# Patient Record
Sex: Female | Born: 1935 | Race: White | Hispanic: No | Smoking: Never smoker
Health system: Southern US, Community
[De-identification: ages and names within clinical notes are randomized; demographics above are authoritative.]

## PROBLEM LIST (undated history)

## (undated) DIAGNOSIS — G309 Alzheimer's disease, unspecified: Secondary | ICD-10-CM

## (undated) DIAGNOSIS — D649 Anemia, unspecified: Secondary | ICD-10-CM

## (undated) DIAGNOSIS — H9190 Unspecified hearing loss, unspecified ear: Secondary | ICD-10-CM

## (undated) DIAGNOSIS — R413 Other amnesia: Secondary | ICD-10-CM

## (undated) DIAGNOSIS — I10 Essential (primary) hypertension: Secondary | ICD-10-CM

## (undated) DIAGNOSIS — F0280 Dementia in other diseases classified elsewhere without behavioral disturbance: Secondary | ICD-10-CM

## (undated) DIAGNOSIS — F32A Depression, unspecified: Secondary | ICD-10-CM

## (undated) DIAGNOSIS — F329 Major depressive disorder, single episode, unspecified: Secondary | ICD-10-CM

## (undated) DIAGNOSIS — E559 Vitamin D deficiency, unspecified: Secondary | ICD-10-CM

## (undated) DIAGNOSIS — I340 Nonrheumatic mitral (valve) insufficiency: Secondary | ICD-10-CM

## (undated) DIAGNOSIS — I351 Nonrheumatic aortic (valve) insufficiency: Secondary | ICD-10-CM

## (undated) HISTORY — DX: Anemia, unspecified: D64.9

## (undated) HISTORY — PX: TONSILLECTOMY: SUR1361

## (undated) HISTORY — DX: Nonrheumatic aortic (valve) insufficiency: I35.1

## (undated) HISTORY — DX: Vitamin D deficiency, unspecified: E55.9

## (undated) HISTORY — PX: CHOLECYSTECTOMY: SHX55

## (undated) HISTORY — DX: Nonrheumatic mitral (valve) insufficiency: I34.0

## (undated) HISTORY — DX: Unspecified hearing loss, unspecified ear: H91.90

## (undated) HISTORY — DX: Other amnesia: R41.3

---

## 1997-09-19 ENCOUNTER — Inpatient Hospital Stay (HOSPITAL_COMMUNITY): Admission: RE | Admit: 1997-09-19 | Discharge: 1997-09-20 | Payer: Self-pay | Admitting: General Surgery

## 1997-09-28 ENCOUNTER — Other Ambulatory Visit: Admission: RE | Admit: 1997-09-28 | Discharge: 1997-09-28 | Payer: Self-pay | Admitting: Obstetrics and Gynecology

## 1998-10-30 ENCOUNTER — Other Ambulatory Visit: Admission: RE | Admit: 1998-10-30 | Discharge: 1998-10-30 | Payer: Self-pay | Admitting: Obstetrics and Gynecology

## 1999-10-22 ENCOUNTER — Other Ambulatory Visit: Admission: RE | Admit: 1999-10-22 | Discharge: 1999-10-22 | Payer: Self-pay | Admitting: Obstetrics and Gynecology

## 2000-10-25 ENCOUNTER — Other Ambulatory Visit: Admission: RE | Admit: 2000-10-25 | Discharge: 2000-10-25 | Payer: Self-pay | Admitting: Obstetrics and Gynecology

## 2001-10-28 ENCOUNTER — Other Ambulatory Visit: Admission: RE | Admit: 2001-10-28 | Discharge: 2001-10-28 | Payer: Self-pay | Admitting: Obstetrics and Gynecology

## 2002-10-30 ENCOUNTER — Other Ambulatory Visit: Admission: RE | Admit: 2002-10-30 | Discharge: 2002-10-30 | Payer: Self-pay | Admitting: Obstetrics and Gynecology

## 2003-06-18 ENCOUNTER — Ambulatory Visit (HOSPITAL_COMMUNITY): Admission: RE | Admit: 2003-06-18 | Discharge: 2003-06-18 | Payer: Self-pay | Admitting: Otolaryngology

## 2003-11-02 ENCOUNTER — Other Ambulatory Visit: Admission: RE | Admit: 2003-11-02 | Discharge: 2003-11-02 | Payer: Self-pay | Admitting: Obstetrics and Gynecology

## 2004-11-04 ENCOUNTER — Other Ambulatory Visit: Admission: RE | Admit: 2004-11-04 | Discharge: 2004-11-04 | Payer: Self-pay | Admitting: Obstetrics and Gynecology

## 2007-06-27 ENCOUNTER — Encounter: Admission: RE | Admit: 2007-06-27 | Discharge: 2007-06-27 | Payer: Self-pay | Admitting: Family Medicine

## 2009-01-15 ENCOUNTER — Ambulatory Visit: Payer: Self-pay | Admitting: Internal Medicine

## 2009-01-15 DIAGNOSIS — I10 Essential (primary) hypertension: Secondary | ICD-10-CM | POA: Insufficient documentation

## 2010-12-30 ENCOUNTER — Emergency Department (HOSPITAL_COMMUNITY): Payer: Medicare Other

## 2010-12-30 ENCOUNTER — Other Ambulatory Visit: Payer: Self-pay

## 2010-12-30 ENCOUNTER — Emergency Department (HOSPITAL_COMMUNITY): Payer: Self-pay

## 2010-12-30 ENCOUNTER — Encounter: Payer: Self-pay | Admitting: *Deleted

## 2010-12-30 ENCOUNTER — Observation Stay (HOSPITAL_COMMUNITY)
Admission: EM | Admit: 2010-12-30 | Discharge: 2011-01-01 | Disposition: A | Discharge status: Home / Self Care | Payer: Medicare Other | Attending: Family Medicine | Admitting: Family Medicine

## 2010-12-30 DIAGNOSIS — G309 Alzheimer's disease, unspecified: Secondary | ICD-10-CM | POA: Insufficient documentation

## 2010-12-30 DIAGNOSIS — R404 Transient alteration of awareness: Secondary | ICD-10-CM | POA: Insufficient documentation

## 2010-12-30 DIAGNOSIS — I517 Cardiomegaly: Secondary | ICD-10-CM | POA: Insufficient documentation

## 2010-12-30 DIAGNOSIS — R030 Elevated blood-pressure reading, without diagnosis of hypertension: Secondary | ICD-10-CM | POA: Insufficient documentation

## 2010-12-30 DIAGNOSIS — Z66 Do not resuscitate: Secondary | ICD-10-CM | POA: Insufficient documentation

## 2010-12-30 DIAGNOSIS — F0280 Dementia in other diseases classified elsewhere without behavioral disturbance: Secondary | ICD-10-CM | POA: Diagnosis present

## 2010-12-30 DIAGNOSIS — J690 Pneumonitis due to inhalation of food and vomit: Secondary | ICD-10-CM | POA: Insufficient documentation

## 2010-12-30 DIAGNOSIS — R29810 Facial weakness: Secondary | ICD-10-CM | POA: Insufficient documentation

## 2010-12-30 DIAGNOSIS — R7309 Other abnormal glucose: Secondary | ICD-10-CM | POA: Insufficient documentation

## 2010-12-30 DIAGNOSIS — Q283 Other malformations of cerebral vessels: Secondary | ICD-10-CM | POA: Insufficient documentation

## 2010-12-30 DIAGNOSIS — I10 Essential (primary) hypertension: Secondary | ICD-10-CM

## 2010-12-30 DIAGNOSIS — I700 Atherosclerosis of aorta: Secondary | ICD-10-CM | POA: Insufficient documentation

## 2010-12-30 DIAGNOSIS — I672 Cerebral atherosclerosis: Secondary | ICD-10-CM | POA: Insufficient documentation

## 2010-12-30 DIAGNOSIS — R739 Hyperglycemia, unspecified: Secondary | ICD-10-CM

## 2010-12-30 DIAGNOSIS — Z79899 Other long term (current) drug therapy: Secondary | ICD-10-CM | POA: Insufficient documentation

## 2010-12-30 DIAGNOSIS — D696 Thrombocytopenia, unspecified: Secondary | ICD-10-CM | POA: Insufficient documentation

## 2010-12-30 DIAGNOSIS — R1110 Vomiting, unspecified: Secondary | ICD-10-CM | POA: Insufficient documentation

## 2010-12-30 DIAGNOSIS — R4789 Other speech disturbances: Secondary | ICD-10-CM | POA: Insufficient documentation

## 2010-12-30 DIAGNOSIS — G459 Transient cerebral ischemic attack, unspecified: Principal | ICD-10-CM | POA: Insufficient documentation

## 2010-12-30 DIAGNOSIS — I6789 Other cerebrovascular disease: Secondary | ICD-10-CM | POA: Insufficient documentation

## 2010-12-30 HISTORY — DX: Depression, unspecified: F32.A

## 2010-12-30 HISTORY — DX: Alzheimer's disease, unspecified: G30.9

## 2010-12-30 HISTORY — DX: Dementia in other diseases classified elsewhere, unspecified severity, without behavioral disturbance, psychotic disturbance, mood disturbance, and anxiety: F02.80

## 2010-12-30 HISTORY — DX: Essential (primary) hypertension: I10

## 2010-12-30 HISTORY — DX: Major depressive disorder, single episode, unspecified: F32.9

## 2010-12-30 LAB — BASIC METABOLIC PANEL
CO2: 26 mEq/L (ref 19–32)
Calcium: 9.7 mg/dL (ref 8.4–10.5)
Chloride: 102 mEq/L (ref 96–112)
GFR calc Af Amer: 68 mL/min — ABNORMAL LOW (ref >90)
GFR calc non Af Amer: 59 mL/min — ABNORMAL LOW (ref >90)

## 2010-12-30 LAB — DIFFERENTIAL
Basophils Absolute: 0 10*3/uL (ref 0.0–0.1)
Basophils Relative: 0 % (ref 0–1)
Eosinophils Absolute: 0 10*3/uL (ref 0.0–0.7)
Monocytes Absolute: 0.4 10*3/uL (ref 0.1–1.0)
Monocytes Relative: 3 % (ref 3–12)
Neutro Abs: 10.4 10*3/uL — ABNORMAL HIGH (ref 1.7–7.7)

## 2010-12-30 LAB — URINALYSIS, ROUTINE W REFLEX MICROSCOPIC
Bilirubin Urine: NEGATIVE
Glucose, UA: NEGATIVE mg/dL
Hgb urine dipstick: NEGATIVE
Ketones, ur: NEGATIVE mg/dL
Leukocytes, UA: NEGATIVE
Nitrite: NEGATIVE
Protein, ur: NEGATIVE mg/dL
Specific Gravity, Urine: 1.018 (ref 1.005–1.030)
Urobilinogen, UA: 1 mg/dL (ref 0.0–1.0)
pH: 5.5 (ref 5.0–8.0)

## 2010-12-30 LAB — CBC
HCT: 46.3 % — ABNORMAL HIGH (ref 36.0–46.0)
Hemoglobin: 15.7 g/dL — ABNORMAL HIGH (ref 12.0–15.0)
MCH: 31.9 pg (ref 26.0–34.0)
MCHC: 33.9 g/dL (ref 30.0–36.0)
MCV: 94.1 fL (ref 78.0–100.0)
Platelets: 129 K/uL — ABNORMAL LOW (ref 150–400)
RBC: 4.92 MIL/uL (ref 3.87–5.11)
RDW: 13.3 % (ref 11.5–15.5)
WBC: 10.9 K/uL — ABNORMAL HIGH (ref 4.0–10.5)

## 2010-12-30 LAB — PROTIME-INR
INR: 1.04 (ref 0.00–1.49)
Prothrombin Time: 13.8 s (ref 11.6–15.2)

## 2010-12-30 LAB — GLUCOSE, CAPILLARY: Glucose-Capillary: 112 mg/dL — ABNORMAL HIGH (ref 70–99)

## 2010-12-30 LAB — CK TOTAL AND CKMB (NOT AT ARMC): Total CK: 105 U/L (ref 7–177)

## 2010-12-30 LAB — APTT: aPTT: 30 s (ref 24–37)

## 2010-12-30 MED ORDER — ACETAMINOPHEN 325 MG PO TABS: 650.0000 mg | ORAL_TABLET | ORAL | Status: DC | PRN

## 2010-12-30 MED ORDER — MEMANTINE HCL 10 MG PO TABS
10.0000 mg | ORAL_TABLET | Freq: Two times a day (BID) | ORAL | Status: DC
  Administered 2010-12-30 – 2011-01-01 (×4): 10 mg via ORAL
  Filled 2010-12-30 (×5): qty 1

## 2010-12-30 MED ORDER — ENOXAPARIN SODIUM 40 MG/0.4ML ~~LOC~~ SOLN
40.0000 mg | SUBCUTANEOUS | Status: DC
  Administered 2010-12-31: 40 mg via SUBCUTANEOUS
  Filled 2010-12-30 (×3): qty 0.4

## 2010-12-30 MED ORDER — ASPIRIN 325 MG PO TABS
325.0000 mg | ORAL_TABLET | Freq: Every day | ORAL | Status: DC
  Administered 2010-12-31 – 2011-01-01 (×2): 325 mg via ORAL
  Filled 2010-12-30 (×2): qty 1

## 2010-12-30 MED ORDER — SODIUM CHLORIDE 0.9 % IJ SOLN
3.0000 mL | Freq: Two times a day (BID) | INTRAMUSCULAR | Status: DC
  Administered 2010-12-30 – 2011-01-01 (×4): 3 mL via INTRAVENOUS

## 2010-12-30 MED ORDER — DONEPEZIL HCL 10 MG PO TABS
10.0000 mg | ORAL_TABLET | Freq: Every day | ORAL | Status: DC
  Administered 2010-12-30 – 2010-12-31 (×2): 10 mg via ORAL
  Filled 2010-12-30 (×3): qty 1

## 2010-12-30 MED ORDER — SERTRALINE HCL 25 MG PO TABS
25.0000 mg | ORAL_TABLET | Freq: Every evening | ORAL | Status: DC
  Administered 2010-12-30 – 2010-12-31 (×2): 25 mg via ORAL
  Filled 2010-12-30 (×3): qty 1

## 2010-12-30 MED ORDER — ONDANSETRON HCL 4 MG/2ML IJ SOLN: 4.0000 mg | Freq: Four times a day (QID) | INTRAMUSCULAR | Status: DC | PRN

## 2010-12-30 NOTE — H&P (Addendum)
Michelle Savage is an 77 y.o. female.    PCP: No primary provider on file. Her PCP is Dr. Cam Hai. Her neurologist is Dr. Anne Hahn (dementia).  Chief Complaint: Vomiting, and facial droop. This morning  HPI: Patient is 77 year old, Caucasian female, with past with history of dementia and borderline hypertension, who was in her usual state of health when she woke up this morning at around 7:00 or so. Her aide stayed with her last night and history is obtained from her. Patient apparently had a large episode of emesis. She saw that patient had a droop on the right side of her face. She was also leaning to was the right side. She called her daughter, who asked that she be brought into the emergency department. Patient was not very coherent, her speech was impaired and she was mumbling. She did however, get on her feet on her own. She was however wobbling. After a brief while she was able to steady herself and then she went on to take a shower before she came in to the hospital. There is no history of any syncopal episode. Patient does not remember any of the above. So, most of the history was obtained from the patient's daughter and the nursing aide. She patient does denies any chest pain at this time. Patient also had pain in her left leg, which is now resolved. The aide does not notice any other focal deficits. Both the aide and the daughter think that the patient is back to her baseline.  The daughter also told me that the patient's dementia has been progressing. Patient has had some wandering episodes. And that's why the aide decided to stay overnight. The patient also had a dog who died about a month ago, and the patient has not dealt with that loss appropriately and at times still asks about the dog.   Prior to Admission medications   Medication Sig Start Date End Date Taking? Authorizing Provider  Calcium-Vitamin D 600-200 MG-UNIT per tablet Take 1 tablet by mouth daily.     Yes  Historical Provider, MD  donepezil (ARICEPT) 10 MG tablet Take 10 mg by mouth daily.    Yes Historical Provider, MD  fish oil-omega-3 fatty acids 1000 MG capsule Take 1 g by mouth daily.    Yes Historical Provider, MD  memantine (NAMENDA) 10 MG tablet Take 10 mg by mouth 2 (two) times daily.     Yes Historical Provider, MD  sertraline (ZOLOFT) 25 MG tablet Take 25 mg by mouth every evening.     Yes Historical Provider, MD    Allergies: No Known Allergies  Past Medical History  Diagnosis Date  . Alzheimer disease   . Depression   . Hypertension     Past Surgical History  Procedure Date  . Cholecystectomy     Social History:  reports that she has never smoked. She has never used smokeless tobacco. She reports that she does not drink alcohol or use illicit drugs.  Family History: History reviewed. No pertinent family history.  Review of Systems  Unable to perform ROS: dementia   Blood pressure 101/51, pulse 82, temperature 99.2 F (37.3 C), temperature source Oral, resp. rate 20, weight 68.2 kg (150 lb 5.7 oz), SpO2 95.00%. Physical Exam  Vitals reviewed. Constitutional: She appears well-developed and well-nourished. No distress.  HENT:  Head: Normocephalic and atraumatic.  Mouth/Throat: Oropharynx is clear and moist. No oropharyngeal exudate.  Eyes: No scleral icterus.  Neck: Normal range of motion. Neck  supple. No JVD present. No tracheal deviation present. No thyromegaly present.  Cardiovascular: Normal rate and regular rhythm.  Exam reveals no gallop and no friction rub.   No murmur heard. Pulmonary/Chest: No stridor.  Abdominal: Soft. Bowel sounds are normal. She exhibits no distension and no mass. There is no tenderness. There is no rebound and no guarding.  Musculoskeletal: Normal range of motion. She exhibits no edema and no tenderness.       Left leg did not show any deformity. No tenderness was elicited in the calf or elsewhere. No erythema noted.  Neurological: She  is alert. She has normal strength and normal reflexes. She is disoriented. No cranial nerve deficit or sensory deficit. Coordination normal.  Skin: Skin is warm and dry. No rash noted. She is not diaphoretic.  Psychiatric: She has a normal mood and affect.    Results for orders placed during the hospital encounter of 12/30/10 (from the past 48 hour(s))  PROTIME-INR     Status: Normal   Collection Time   12/30/10 12:30 PM      Component Value Range Comment   Prothrombin Time 13.8  11.6 - 15.2 (seconds)    INR 1.04  0.00 - 1.49    APTT     Status: Normal   Collection Time   12/30/10 12:30 PM      Component Value Range Comment   aPTT 30  24 - 37 (seconds)   CBC     Status: Abnormal   Collection Time   12/30/10 12:30 PM      Component Value Range Comment   WBC 10.9 (*) 4.0 - 10.5 (K/uL)    RBC 4.92  3.87 - 5.11 (MIL/uL)    Hemoglobin 15.7 (*) 12.0 - 15.0 (g/dL)    HCT 16.1 (*) 09.6 - 46.0 (%)    MCV 94.1  78.0 - 100.0 (fL)    MCH 31.9  26.0 - 34.0 (pg)    MCHC 33.9  30.0 - 36.0 (g/dL)    RDW 04.5  40.9 - 81.1 (%)    Platelets 129 (*) 150 - 400 (K/uL)   DIFFERENTIAL     Status: Abnormal   Collection Time   12/30/10 12:30 PM      Component Value Range Comment   Neutrophils Relative 96 (*) 43 - 77 (%)    Neutro Abs 10.4 (*) 1.7 - 7.7 (K/uL)    Lymphocytes Relative 1 (*) 12 - 46 (%)    Lymphs Abs 0.1 (*) 0.7 - 4.0 (K/uL)    Monocytes Relative 3  3 - 12 (%)    Monocytes Absolute 0.4  0.1 - 1.0 (K/uL)    Eosinophils Relative 0  0 - 5 (%)    Eosinophils Absolute 0.0  0.0 - 0.7 (K/uL)    Basophils Relative 0  0 - 1 (%)    Basophils Absolute 0.0  0.0 - 0.1 (K/uL)   CK TOTAL AND CKMB     Status: Normal   Collection Time   12/30/10 12:30 PM      Component Value Range Comment   Total CK 105  7 - 177 (U/L)    CK, MB 2.6  0.3 - 4.0 (ng/mL)    Relative Index 2.5  0.0 - 2.5    TROPONIN I     Status: Normal   Collection Time   12/30/10 12:30 PM      Component Value Range Comment    Troponin I <0.30  <0.30 (ng/mL)   BASIC  METABOLIC PANEL     Status: Abnormal   Collection Time   12/30/10 12:30 PM      Component Value Range Comment   Sodium 141  135 - 145 (mEq/L)    Potassium 3.7  3.5 - 5.1 (mEq/L)    Chloride 102  96 - 112 (mEq/L)    CO2 26  19 - 32 (mEq/L)    Glucose, Bld 200 (*) 70 - 99 (mg/dL)    BUN 35 (*) 6 - 23 (mg/dL)    Creatinine, Ser 1.61  0.50 - 1.10 (mg/dL)    Calcium 9.7  8.4 - 10.5 (mg/dL)    GFR calc non Af Amer 59 (*) >90 (mL/min)    GFR calc Af Amer 68 (*) >90 (mL/min)   URINALYSIS, ROUTINE W REFLEX MICROSCOPIC     Status: Abnormal   Collection Time   12/30/10  2:25 PM      Component Value Range Comment   Color, Urine YELLOW  YELLOW     Appearance CLOUDY (*) CLEAR     Specific Gravity, Urine 1.018  1.005 - 1.030     pH 5.5  5.0 - 8.0     Glucose, UA NEGATIVE  NEGATIVE (mg/dL)    Hgb urine dipstick NEGATIVE  NEGATIVE     Bilirubin Urine NEGATIVE  NEGATIVE     Ketones, ur NEGATIVE  NEGATIVE (mg/dL)    Protein, ur NEGATIVE  NEGATIVE (mg/dL)    Urobilinogen, UA 1.0  0.0 - 1.0 (mg/dL)    Nitrite NEGATIVE  NEGATIVE     Leukocytes, UA NEGATIVE  NEGATIVE  MICROSCOPIC NOT DONE ON URINES WITH NEGATIVE PROTEIN, BLOOD, LEUKOCYTES, NITRITE, OR GLUCOSE <1000 mg/dL.   Ct Head Wo Contrast  12/30/2010  *RADIOLOGY REPORT*  Clinical Data: Altered level of consciousness, emesis,  memory loss  CT HEAD WITHOUT CONTRAST  Technique:  Contiguous axial images were obtained from the base of the skull through the vertex without contrast.  Comparison: MRI scan of Jun 27, 2007  Findings: Bony calvarium appears intact.  No mass effect or midline shift is noted.  Ventricular size is within normal limits.  Chronic ischemic white matter disease is noted in the periventricular region.  There is no evidence of mass, hemorrhage or acute infarction.  IMPRESSION: Chronic ischemic white matter disease.  No acute intracranial abnormality seen.  Original Report Authenticated By: Venita Sheffield., M.D.   EKG shows a sinus rhythm at 65 beats per minute. PACs are noted. Axis is normal. Intervals appear to be in the normal range. No Q waves. No concerning ST or T-wave changes are noted. No older EKGs available for comparison.  Assessment/Plan  Principal Problem:  *TIA (transient ischemic attack) Active Problems:  HYPERTENSION  Dementia  Hyperglycemia   #1 questionable TIA: We'll admit the patient to the hospital for further workup. This will be in the form of MRI of the brain, MRA of the head, Carotid Dopplers, echocardiogram. Lipid panel will be checked in the morning. Once she passes her swallow screen she will be given aspirin by mouth. Physical therapy and occupational therapy will be asked to see the patient as well.  #2 hyperglycemia: Patient does not have a history of diabetes. We will recheck another fasting level in the morning and will proceed with the HbA1c as well. For now we'll hold off on sliding scale.  #3 history of dementia: Continue with Aricept and Namenda. Considering her episodes of wandering patient may require 24-hour care at home. Will  await the evaluation by physical therapy and occupational therapy at this time.  #4 history of hypertension: This is borderline. Patient used to be on atenolol until a few months ago, but is no longer taking it. This is because, according to the daughter, the blood pressure has been very well controlled.  The reason for her vomiting is not clear. She is currently not nauseous. Her abdomen is soft. We will monitor for now without additional testing.  DVT, prophylaxis will be initiated.  CODE STATUS was discussed with the patient's daughter and the patient will be a DO NOT RESUSCITATE.  Further management decisions will depend on results of further testing and patient's response to treatment.  Chesley Valls 12/30/2010, 3:44 PM

## 2010-12-30 NOTE — ED Notes (Signed)
Per care giver pt disoriented this am w/speech change, uncoordinated, and R jaw drooping. Pt's speech thick and slurred, pt unsure of surroundings, time of day, "half asleep," and weakness. No neuro deficits noted, no facial droop, PERRL. Pt began vomiting during the night, care giver found vomit on bedroom floor, bed sheets, and "all over" bathroom floor and toilet. Pt denies abd pain, chest pain, headache at his time. Care giver reports assisting pt to the bed and pt c/o L leg pain while holding pt's L leg up

## 2010-12-30 NOTE — ED Notes (Signed)
Tresa Endo RN in [facility] unable to take report on any new pt's at this time, TCU is full, Eula Listen will return writers call when she dc any of her pt

## 2010-12-30 NOTE — ED Notes (Signed)
Attempt x1 to give report to receiving RN, RN unable to take report at this time

## 2010-12-30 NOTE — ED Notes (Signed)
Daughter states "vomiting probably started around 0500, caregiver went in around 0845 and vomit was everywhere, not talking right & leaning to the side"

## 2010-12-30 NOTE — ED Notes (Signed)
Bess RN given report, Leisure centre manager requested we bring pt upstairs at [facility] per floor policy, Leisure centre manager will give oncoming RN nursing report, oncoming ED RN does not need to call and give report, just give a call prior to transport, no RN to RN report needed

## 2010-12-30 NOTE — ED Notes (Signed)
Attempt x2 to give receiving RN report, RN unavailable

## 2010-12-30 NOTE — ED Provider Notes (Signed)
History     CSN: [id number] Arrival date & time: 12/30/2010 11:05 AM   First MD Initiated Contact with Patient 12/30/10 1118      Chief Complaint  Patient presents with  . Emesis  . Memory Loss    (Consider location/radiation/quality/duration/timing/severity/associated sxs/prior treatment) HPI Comments: Patient presents today with her daughter and her caregiver with complaints of nausea and vomiting this morning.  The caregivers stayed overnight with the patient and her get up at approximately 5 AM but did not realize she had emesis at that time so she went to check on her approximately 7:30.  Patient was back in bed at that time.  She noted that he upon the patient's waking she had left facial droop and slurred speech that was incoherent.  Patient seemed more unsteady on her feet as well.  This is improving already is patient's speech is not coherent although family notes that it is still different from her baseline.  There is no specific weakness and no further nausea or vomiting has occurred.  Patient was normal last night when she went to bed.  No prior history for any strokes.  No new medications given.  No fevers.  Patient is a 77 y.o. female presenting with vomiting. The history is provided by the patient and a relative.  Emesis  This is a new problem. The problem has been rapidly improving. There has been no fever. Pertinent negatives include no abdominal pain, no chills, no cough, no diarrhea, no fever and no headaches.    Past Medical History  Diagnosis Date  . Alzheimer disease   . Depression     Past Surgical History  Procedure Date  . Cholecystectomy     No family history on file.  History  Substance Use Topics  . Smoking status: Never Smoker   . Smokeless tobacco: Not on file  . Alcohol Use: No    OB History    Grav Para Term Preterm Abortions TAB SAB Ect Mult Living                  Review of Systems  Constitutional: Negative for fever and chills.    Eyes: Negative.  Negative for discharge and redness.  Respiratory: Negative.  Negative for cough and shortness of breath.   Cardiovascular: Negative.  Negative for chest pain.  Gastrointestinal: Positive for nausea and vomiting. Negative for abdominal pain and diarrhea.  Genitourinary: Negative.  Negative for dysuria and vaginal discharge.  Musculoskeletal: Negative.  Negative for back pain.  Skin: Negative.  Negative for color change and rash.  Neurological: Positive for speech difficulty and weakness. Negative for syncope and headaches.  Hematological: Negative.  Negative for adenopathy.  Psychiatric/Behavioral: Negative.  Negative for confusion.  All other systems reviewed and are negative.    Allergies  Review of patient's allergies indicates no known allergies.  Home Medications   Current Outpatient Rx  Name Route Sig Dispense Refill  . DONEPEZIL HCL 10 MG PO TABS Oral Take 10 mg by mouth at bedtime as needed.      . OMEGA-3 FATTY ACIDS 1000 MG PO CAPS Oral Take 2 g by mouth daily.      Marland Kitchen MEMANTINE HCL 10 MG PO TABS Oral Take 10 mg by mouth 2 (two) times daily.        BP 101/51  Pulse 82  Temp(Src) 98.7 F (37.1 C) (Oral)  Resp 20  Wt 150 lb 5.7 oz (68.2 kg)  SpO2 95%  Physical Exam  Constitutional: She is oriented to person, place, and time. She appears well-developed and well-nourished.  Non-toxic appearance. She does not have a sickly appearance.  HENT:  Head: Normocephalic and atraumatic.  Eyes: Conjunctivae, EOM and lids are normal. Pupils are equal, round, and reactive to light. No scleral icterus.  Neck: Trachea normal and normal range of motion. Neck supple.  Cardiovascular: Normal rate, regular rhythm and normal heart sounds.   Pulmonary/Chest: Effort normal and breath sounds normal. No respiratory distress. She has no wheezes. She has no rales. She exhibits no tenderness.  Abdominal: Soft. Normal appearance. There is no tenderness. There is no rebound, no  guarding and no CVA tenderness.  Musculoskeletal: Normal range of motion.  Neurological: She is alert and oriented to person, place, and time. She has normal strength.       Patient  face appears symmetric to me.  Tongue is midline.  Speech is clear.  No visual field deficits.  Extraocular eye movements are normal.  No pronator drift.  Normal finger nose bilaterally.  Normal heel to shin bilaterally.  Normal strength in her upper extremities and lower extremities.  Sensation to light touch is intact.  Normal gait.  Skin: Skin is warm, dry and intact. No rash noted.  Psychiatric: She has a normal mood and affect. Her behavior is normal. Judgment and thought content normal.    ED Course  Procedures (including critical care time)    Results for orders placed during the hospital encounter of 12/30/10  PROTIME-INR      Component Value Range   Prothrombin Time 13.8  11.6 - 15.2 (seconds)   INR 1.04  0.00 - 1.49   APTT      Component Value Range   aPTT 30  24 - 37 (seconds)  CBC      Component Value Range   WBC 10.9 (*) 4.0 - 10.5 (K/uL)   RBC 4.92  3.87 - 5.11 (MIL/uL)   Hemoglobin 15.7 (*) 12.0 - 15.0 (g/dL)   HCT 40.9 (*) 81.1 - 46.0 (%)   MCV 94.1  78.0 - 100.0 (fL)   MCH 31.9  26.0 - 34.0 (pg)   MCHC 33.9  30.0 - 36.0 (g/dL)   RDW 91.4  78.2 - 95.6 (%)   Platelets 129 (*) 150 - 400 (K/uL)  DIFFERENTIAL      Component Value Range   Neutrophils Relative 96 (*) 43 - 77 (%)   Neutro Abs 10.4 (*) 1.7 - 7.7 (K/uL)   Lymphocytes Relative 1 (*) 12 - 46 (%)   Lymphs Abs 0.1 (*) 0.7 - 4.0 (K/uL)   Monocytes Relative 3  3 - 12 (%)   Monocytes Absolute 0.4  0.1 - 1.0 (K/uL)   Eosinophils Relative 0  0 - 5 (%)   Eosinophils Absolute 0.0  0.0 - 0.7 (K/uL)   Basophils Relative 0  0 - 1 (%)   Basophils Absolute 0.0  0.0 - 0.1 (K/uL)  CK TOTAL AND CKMB      Component Value Range   Total CK 105  7 - 177 (U/L)   CK, MB 2.6  0.3 - 4.0 (ng/mL)   Relative Index 2.5  0.0 - 2.5   TROPONIN I       Component Value Range   Troponin I <0.30  <0.30 (ng/mL)  BASIC METABOLIC PANEL      Component Value Range   Sodium 141  135 - 145 (mEq/L)   Potassium 3.7  3.5 - 5.1 (mEq/L)  Chloride 102  96 - 112 (mEq/L)   CO2 26  19 - 32 (mEq/L)   Glucose, Bld 200 (*) 70 - 99 (mg/dL)   BUN 35 (*) 6 - 23 (mg/dL)   Creatinine, Ser 1.61  0.50 - 1.10 (mg/dL)   Calcium 9.7  8.4 - 09.6 (mg/dL)   GFR calc non Af Amer 59 (*) >90 (mL/min)   GFR calc Af Amer 68 (*) >90 (mL/min)  URINALYSIS, ROUTINE W REFLEX MICROSCOPIC      Component Value Range   Color, Urine YELLOW  YELLOW    Appearance CLOUDY (*) CLEAR    Specific Gravity, Urine 1.018  1.005 - 1.030    pH 5.5  5.0 - 8.0    Glucose, UA NEGATIVE  NEGATIVE (mg/dL)   Hgb urine dipstick NEGATIVE  NEGATIVE    Bilirubin Urine NEGATIVE  NEGATIVE    Ketones, ur NEGATIVE  NEGATIVE (mg/dL)   Protein, ur NEGATIVE  NEGATIVE (mg/dL)   Urobilinogen, UA 1.0  0.0 - 1.0 (mg/dL)   Nitrite NEGATIVE  NEGATIVE    Leukocytes, UA NEGATIVE  NEGATIVE    Ct Head Wo Contrast  12/30/2010  *RADIOLOGY REPORT*  Clinical Data: Altered level of consciousness, emesis,  memory loss  CT HEAD WITHOUT CONTRAST  Technique:  Contiguous axial images were obtained from the base of the skull through the vertex without contrast.  Comparison: MRI scan of Jun 27, 2007  Findings: Bony calvarium appears intact.  No mass effect or midline shift is noted.  Ventricular size is within normal limits.  Chronic ischemic white matter disease is noted in the periventricular region.  There is no evidence of mass, hemorrhage or acute infarction.  IMPRESSION: Chronic ischemic white matter disease.  No acute intracranial abnormality seen.  Original Report Authenticated By: Venita Sheffield., M.D.    Date: 12/30/2010  Rate: 65  Rhythm: normal sinus rhythm and premature atrial contractions (PAC)  QRS Axis: normal  Intervals: normal  ST/T Wave abnormalities: normal  Conduction Disutrbances:none  Narrative  Interpretation:   Old EKG Reviewed: none available    MDM  Patient brought in with symptoms consistent with TIA.  Significantly improved from earlier although family still notes a small difference from her normal.  She has no other acute findings on her neurologic exam.  She has no acute changes on her CT. She has no symptoms for acute infection at this time.  As patient woke up with symptoms no code stroke was called.  She also did not appear to have significant symptoms at time of arrival.  I have discussed this patient with Dr. Rito Ehrlich from the [facility] hospitalist and they will admit her to a telemetry bed.        Nat Christen, MD 12/30/10 1452

## 2010-12-30 NOTE — ED Notes (Signed)
  Pt is unable to void but has got a IV waiting on fluids to start

## 2010-12-30 NOTE — ED Notes (Signed)
Pt's grip and strength equal bilaterally.

## 2010-12-30 NOTE — ED Notes (Signed)
Pt placed on monitor by [facility] NT

## 2010-12-31 ENCOUNTER — Other Ambulatory Visit (HOSPITAL_COMMUNITY): Payer: Medicare Other

## 2010-12-31 ENCOUNTER — Inpatient Hospital Stay (HOSPITAL_COMMUNITY): Payer: Medicare Other

## 2010-12-31 DIAGNOSIS — G459 Transient cerebral ischemic attack, unspecified: Secondary | ICD-10-CM

## 2010-12-31 DIAGNOSIS — I369 Nonrheumatic tricuspid valve disorder, unspecified: Secondary | ICD-10-CM

## 2010-12-31 LAB — HEMOGLOBIN A1C: Hgb A1c MFr Bld: 5.3 % (ref <5.7)

## 2010-12-31 LAB — LIPID PANEL
Cholesterol: 138 mg/dL (ref 0–200)
LDL Cholesterol: 67 mg/dL (ref 0–99)
Triglycerides: 69 mg/dL (ref <150)

## 2010-12-31 LAB — CBC
Platelets: 119 10*3/uL — ABNORMAL LOW (ref 150–400)
RBC: 4.33 MIL/uL (ref 3.87–5.11)
RDW: 13.5 % (ref 11.5–15.5)
WBC: 6.2 10*3/uL (ref 4.0–10.5)

## 2010-12-31 LAB — BASIC METABOLIC PANEL
CO2: 26 mEq/L (ref 19–32)
Chloride: 102 mEq/L (ref 96–112)
GFR calc Af Amer: 88 mL/min — ABNORMAL LOW (ref >90)
Sodium: 137 mEq/L (ref 135–145)

## 2010-12-31 LAB — GLUCOSE, CAPILLARY: Glucose-Capillary: 101 mg/dL — ABNORMAL HIGH (ref 70–99)

## 2010-12-31 MED ORDER — POTASSIUM CHLORIDE CRYS ER 20 MEQ PO TBCR
40.0000 meq | EXTENDED_RELEASE_TABLET | Freq: Three times a day (TID) | ORAL | Status: AC
  Administered 2010-12-31 (×3): 40 meq via ORAL
  Filled 2010-12-31 (×3): qty 2

## 2010-12-31 NOTE — Progress Notes (Signed)
PROGRESS NOTE  Michelle Savage GEX:[id number] DOB: [birth year] DOA: 12/30/2010  Brief narrative: 77 year old woman who was noted at home to have vomiting and dysarthria and leaning to one side upon awakening approximately 7 AM on day of admission. Per emergency physician documentation it was reported that the patient had left facial droop and slurred speech. Per emergency physician exam the face appeared symmetric and speech was clear. There is no dysdiadochokinesis. Normal strength in the upper and lower extremities was documented.  Per admission documentation the family felt the patient was at baseline on admission. Patient was admitted for possible TIA, hyperglycemia without a history of diabetes, vomiting.  Past medical history: Alzheimer's dementia (progressing, per daughter, wandering noted) , depression. Has an aide that stays during the day.   Subjective: No complaints today. Feeling quite well. No abdominal pain. No vomiting. No leg pain. No focal neurologic issues.  Objective: Filed Vitals:   12/31/10 0641  BP: 105/62  Pulse: 66  Temp: 97.9 F (36.6 C)  Resp: 20   Blood pressure 105/62, pulse 66, temperature 97.9 F (36.6 C), temperature source Oral, resp. rate 20, height 5\' 2"  (1.575 m), weight 69.3 kg (152 lb 12.5 oz), SpO2 93.00%.   Intake/Output Summary (Last 24 hours) at 12/31/10 1010 Last data filed at 12/31/10 0846  Gross per 24 hour  Intake      0 ml  Output    300 ml  Net   -300 ml    Exam: General: Appears well. Cardiovascular: Regular rate and rhythm. No murmur, rub or gallop. Respiratory: Clear to auscultation bilaterally. No wheezes, rales or rhonchi. Normal respiratory effort Abdomen: Soft nontender nondistended. Neurologic: Cranial nerves 2-12 intact. Speech fluent and clear. Musculoskeletal: Tone and strength in the upper lower extremities appears to be normal. Symmetric.  Telemetry: Sinus rhythm, no arrhythmias.  Assessment/Plan: TIA:  Neurologic symptoms resolved. MRI of the brain: no acute abnormality. MRA of the head: Moderate stenosis right posterior cerebral artery. LDL 67. Complete 2-D echocardiogram and carotid ultrasound as well as physical and occupational therapy consultations. The patient was not on aspirin at home therefore will add an aspirin. LDL does not meet criteria for anti-lipid therapy and blood pressure stable without medication. Vomiting: Resolved. No pain. Probably related to TIA. Abnormal chest x-ray: Admission chest x-ray was notable for right middle lobe aspiration or pneumonia. Patient's lungs are clear on examination. She requires no oxygen and her respiratory effort is normal. I suspect a aspiration pneumonitis secondary to vomiting. She is afebrile. No antibiotics. Repeat chest x-ray. Hyperglycemia: Fasting blood sugar 93. Hemoglobin A1c 5.3. No further evaluation. Discontinue sliding scale and glucose checks. Hypokalemia: Potassium 2.9. Replete. Thrombocytopenia: Platelets 119 today. Continue to follow. Repeat CBC as an outpatient. Dementia: Progressing per family. Continue Aricept and Namenda. Await physical and occupational therapy recommendations. Hypertension: Currently not on any outpatient antihypertensives.  Code Status:DO NOT RESUSCITATE  Family Communication: Spoke with her daughter Michelle Savage) at bedside: [name], [phone]. Discussed plan for further testing and disposition.  Disposition Plan: She hopes patient can return home.   PCP: No primary provider on file. Cam Hai, M.D. Neurologist: Lesia Sago, M.D. (dementia)   LOS: 1 day   Pandora Leiter MD   Data Reviewed: Results for orders placed during the hospital encounter of 12/30/10 (from the past 24 hour(s))  PROTIME-INR     Status: Normal   Collection Time   12/30/10 12:30 PM      Component Value Range   Prothrombin Time 13.8  11.6 -  15.2 (seconds)   INR 1.04  0.00 - 1.49   APTT     Status: Normal    Collection Time   12/30/10 12:30 PM      Component Value Range   aPTT 30  24 - 37 (seconds)  CBC     Status: Abnormal   Collection Time   12/30/10 12:30 PM      Component Value Range   WBC 10.9 (*) 4.0 - 10.5 (K/uL)   RBC 4.92  3.87 - 5.11 (MIL/uL)   Hemoglobin 15.7 (*) 12.0 - 15.0 (g/dL)   HCT 16.1 (*) 09.6 - 46.0 (%)   MCV 94.1  78.0 - 100.0 (fL)   MCH 31.9  26.0 - 34.0 (pg)   MCHC 33.9  30.0 - 36.0 (g/dL)   RDW 04.5  40.9 - 81.1 (%)   Platelets 129 (*) 150 - 400 (K/uL)  DIFFERENTIAL     Status: Abnormal   Collection Time   12/30/10 12:30 PM      Component Value Range   Neutrophils Relative 96 (*) 43 - 77 (%)   Neutro Abs 10.4 (*) 1.7 - 7.7 (K/uL)   Lymphocytes Relative 1 (*) 12 - 46 (%)   Lymphs Abs 0.1 (*) 0.7 - 4.0 (K/uL)   Monocytes Relative 3  3 - 12 (%)   Monocytes Absolute 0.4  0.1 - 1.0 (K/uL)   Eosinophils Relative 0  0 - 5 (%)   Eosinophils Absolute 0.0  0.0 - 0.7 (K/uL)   Basophils Relative 0  0 - 1 (%)   Basophils Absolute 0.0  0.0 - 0.1 (K/uL)  CK TOTAL AND CKMB     Status: Normal   Collection Time   12/30/10 12:30 PM      Component Value Range   Total CK 105  7 - 177 (U/L)   CK, MB 2.6  0.3 - 4.0 (ng/mL)   Relative Index 2.5  0.0 - 2.5   TROPONIN I     Status: Normal   Collection Time   12/30/10 12:30 PM      Component Value Range   Troponin I <0.30  <0.30 (ng/mL)  BASIC METABOLIC PANEL     Status: Abnormal   Collection Time   12/30/10 12:30 PM      Component Value Range   Sodium 141  135 - 145 (mEq/L)   Potassium 3.7  3.5 - 5.1 (mEq/L)   Chloride 102  96 - 112 (mEq/L)   CO2 26  19 - 32 (mEq/L)   Glucose, Bld 200 (*) 70 - 99 (mg/dL)   BUN 35 (*) 6 - 23 (mg/dL)   Creatinine, Ser 9.14  0.50 - 1.10 (mg/dL)   Calcium 9.7  8.4 - 78.2 (mg/dL)   GFR calc non Af Amer 59 (*) >90 (mL/min)   GFR calc Af Amer 68 (*) >90 (mL/min)  URINALYSIS, ROUTINE W REFLEX MICROSCOPIC     Status: Abnormal   Collection Time   12/30/10  2:25 PM      Component Value  Range   Color, Urine YELLOW  YELLOW    Appearance CLOUDY (*) CLEAR    Specific Gravity, Urine 1.018  1.005 - 1.030    pH 5.5  5.0 - 8.0    Glucose, UA NEGATIVE  NEGATIVE (mg/dL)   Hgb urine dipstick NEGATIVE  NEGATIVE    Bilirubin Urine NEGATIVE  NEGATIVE    Ketones, ur NEGATIVE  NEGATIVE (mg/dL)   Protein, ur NEGATIVE  NEGATIVE (mg/dL)  Urobilinogen, UA 1.0  0.0 - 1.0 (mg/dL)   Nitrite NEGATIVE  NEGATIVE    Leukocytes, UA NEGATIVE  NEGATIVE   HEMOGLOBIN A1C     Status: Normal   Collection Time   12/30/10  6:45 PM      Component Value Range   Hemoglobin A1C 5.3  <5.7 (%)   Mean Plasma Glucose 105  <117 (mg/dL)  GLUCOSE, CAPILLARY     Status: Abnormal   Collection Time   12/30/10  9:02 PM      Component Value Range   Glucose-Capillary 112 (*) 70 - 99 (mg/dL)   Comment 1 Documented in Chart     Comment 2 Notify RN    LIPID PANEL     Status: Normal   Collection Time   12/31/10  5:01 AM      Component Value Range   Cholesterol 138  0 - 200 (mg/dL)   Triglycerides 69  <119 (mg/dL)   HDL 57  >14 (mg/dL)   Total CHOL/HDL Ratio 2.4     VLDL 14  0 - 40 (mg/dL)   LDL Cholesterol 67  0 - 99 (mg/dL)  CBC     Status: Abnormal   Collection Time   12/31/10  5:01 AM      Component Value Range   WBC 6.2  4.0 - 10.5 (K/uL)   RBC 4.33  3.87 - 5.11 (MIL/uL)   Hemoglobin 13.3  12.0 - 15.0 (g/dL)   HCT 78.2  95.6 - 21.3 (%)   MCV 94.0  78.0 - 100.0 (fL)   MCH 30.7  26.0 - 34.0 (pg)   MCHC 32.7  30.0 - 36.0 (g/dL)   RDW 08.6  57.8 - 46.9 (%)   Platelets 119 (*) 150 - 400 (K/uL)  BASIC METABOLIC PANEL     Status: Abnormal   Collection Time   12/31/10  5:01 AM      Component Value Range   Sodium 137  135 - 145 (mEq/L)   Potassium 2.9 (*) 3.5 - 5.1 (mEq/L)   Chloride 102  96 - 112 (mEq/L)   CO2 26  19 - 32 (mEq/L)   Glucose, Bld 93  70 - 99 (mg/dL)   BUN 27 (*) 6 - 23 (mg/dL)   Creatinine, Ser 6.29  0.50 - 1.10 (mg/dL)   Calcium 8.2 (*) 8.4 - 10.5 (mg/dL)   GFR calc non Af Amer 76  (*) >90 (mL/min)   GFR calc Af Amer 88 (*) >90 (mL/min)  GLUCOSE, CAPILLARY     Status: Abnormal   Collection Time   12/31/10  7:46 AM      Component Value Range   Glucose-Capillary 101 (*) 70 - 99 (mg/dL)    No results found for this or any previous visit (from the past 240 hour(s)).   Studies/Results: Dg Chest 2 View  12/30/2010  *RADIOLOGY REPORT*  Clinical Data: 77 year old female with weakness, memory loss, TIA.  CHEST - 2 VIEW  Comparison: None.  Findings: Semi upright AP and lateral views of the chest. Confluent airspace disease in the right middle lobe.  Cardiac size at the upper limits of normal. Other mediastinal contours are within normal limits.  Visualized tracheal air column is within normal limits.  Left lung and upper lobes appear clear. No acute osseous abnormality identified.    IMPRESSION: Right middle lobe pneumonia or aspiration.  Post-treatment radiographs recommended to document resolution.  Original Report Authenticated By: Harley Hallmark, M.D.   Ct Head Wo Contrast  12/30/2010  *RADIOLOGY REPORT*  Clinical Data: Altered level of consciousness, emesis,  memory loss  CT HEAD WITHOUT CONTRAST  Technique:  Contiguous axial images were obtained from the base of the skull through the vertex without contrast.  Comparison: MRI scan of Jun 27, 2007  Findings: Bony calvarium appears intact.  No mass effect or midline shift is noted.  Ventricular size is within normal limits.  Chronic ischemic white matter disease is noted in the periventricular region.  There is no evidence of mass, hemorrhage or acute infarction.  IMPRESSION: Chronic ischemic white matter disease.  No acute intracranial abnormality seen.  Original Report Authenticated By: Venita Sheffield., M.D.   Mr Angiogram Head Wo Contrast  12/31/2010  *RADIOLOGY REPORT*  Clinical Data:  TIA.  Slurred speech and confusion  MRI HEAD WITHOUT CONTRAST MRA HEAD WITHOUT CONTRAST  Technique:  Multiplanar, multiecho pulse  sequences of the brain and surrounding structures were obtained without intravenous contrast. Angiographic images of the head were obtained using MRA technique without contrast.  Comparison:  CT 12/30/2010, MRI 06/27/2007  MRI HEAD  Findings:  Negative for acute infarct.  Mild hyperintensity in the cerebral white matter is unchanged from the  prior study compatible with  chronic microvascular ischemia.  Brainstem and cerebellum are intact.  Age appropriate atrophy.  Negative for hydrocephalus.  Negative for hemorrhage or mass lesion.  IMPRESSION: Atrophy and mild chronic microvascular ischemia.  No acute abnormality.  MRA HEAD  Findings: Both vertebral arteries are patent to   pica.  The basilar is a very small vessel appears that to be congenitally small.  There is fetal origin of the posterior cerebral arteries bilaterally.  Prior MRI also showed a small basilar however MRA was not performed on the prior study.  Superior cerebellar and posterior cerebral arteries are patent bilaterally.  Moderate stenosis in the right posterior cerebral artery.  Internal carotid artery is patent bilaterally without stenosis. Anterior and middle cerebral arteries are patent bilaterally.  Negative for cerebral aneurysm.  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  Original Report Authenticated By: Camelia Phenes, M.D.   Mr Brain Wo Contrast  12/31/2010  *RADIOLOGY REPORT*  Clinical Data:  TIA.  Slurred speech and confusion  MRI HEAD WITHOUT CONTRAST MRA HEAD WITHOUT CONTRAST  Technique:  Multiplanar, multiecho pulse sequences of the brain and surrounding structures were obtained without intravenous contrast. Angiographic images of the head were obtained using MRA technique without contrast.  Comparison:  CT 12/30/2010, MRI 06/27/2007  MRI HEAD  Findings:  Negative for acute infarct.  Mild hyperintensity in the cerebral white matter is  unchanged from the  prior study compatible with  chronic microvascular ischemia.  Brainstem and cerebellum are intact.  Age appropriate atrophy.  Negative for hydrocephalus.  Negative for hemorrhage or mass lesion.  IMPRESSION: Atrophy and mild chronic microvascular ischemia.  No acute abnormality.  MRA HEAD  Findings: Both vertebral arteries are patent to   pica.  The basilar is a very small vessel appears that to be congenitally small.  There is fetal origin of the posterior cerebral arteries bilaterally.  Prior MRI also showed a small basilar however MRA was not performed on the prior study.  Superior cerebellar and posterior cerebral arteries are patent bilaterally.  Moderate stenosis in the right posterior cerebral artery.  Internal carotid artery is patent bilaterally without stenosis. Anterior and middle cerebral arteries are patent bilaterally.  Negative for cerebral aneurysm.  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  Original Report Authenticated By: Camelia Phenes, M.D.     Scheduled Meds:   . aspirin  325 mg Oral Daily  . donepezil  10 mg Oral Daily  . enoxaparin  40 mg Subcutaneous Q24H  . memantine  10 mg Oral BID  . sertraline  25 mg Oral QPM  . sodium chloride  3 mL Intravenous Q12H   Continuous Infusions:

## 2010-12-31 NOTE — Progress Notes (Signed)
2D Echo Completed.  Emelia Loron, Sonographer 12/31/10

## 2010-12-31 NOTE — Progress Notes (Addendum)
*  PRELIMINARY RESULTS* Bilateral carotid dopplers performed. Results showed no ICA stenosis. Antegrade vertebral artery flow.  Michelle Savage 12/31/2010, 4:11 PM

## 2010-12-31 NOTE — Plan of Care (Signed)
Problem: Phase II Progression Outcomes Goal: Neuro status stablized/improved Outcome: Completed/Met Date Met:  12/31/10 Back at her baseline per homecare provider at bedside Goal: Able to communicate Outcome: Completed/Met Date Met:  12/31/10 Can make Basic needs known

## 2010-12-31 NOTE — Progress Notes (Signed)
Speech Language/Pathology Clinical/Bedside Swallow Evaluation Patient Details  Name: Michelle Savage MRN: [id number] DOB: Jun 06, 1924 Today's Date: 12/31/2010 [id number]  Past Medical History  Diagnosis Date  . Alzheimer disease   . Depression   . Hypertension    Past Surgical History:  Past Surgical History  Procedure Date  . Cholecystectomy     Assessment/Recommendations/Treatment Plan    SLP Assessment Clinical Impression Statement: Pt appears with functional oropharyngeal swallow without clinical signs/symptoms of asp or dysphagia at bedside with limited po observed.  Pt able to self feed.  Appetite is decreased per caregiver since pt's dog passed away.  Caregiver Asher Muir (who has been with pt 2 years since Feburary) denies pt coughing with intake or dysphagia.  In addition, caregiver did not report pt choking on emesis, but did not witness emesis episode. Agree with MD regarding likely aspiration pneumonitis- not aspiration pna.     Risk for Aspiration: Mild Other Related Risk Factors: Cognitive impairment  Recommendations Solid Consistency: Regular Liquid Consistency: Thin Liquid Administration via: Cup;Straw Medication Administration: Other (Comment) (as tolerated) Supervision: Patient able to self feed Compensations: Slow rate;Small sips/bites (pt takes small bites/sips regularly) Postural Changes and/or Swallow Maneuvers: Head tilt right during swallow Oral Care Recommendations: Oral care BID     Individuals Consulted Consulted and Agree with Results and Recommendations: Patient;Family member/caregiver Family Member Consulted: caregiver Asher Muir educated to precautions and s/s of aspiration/dysphagia  Swallow Study Goals   none  Secondary to no follow up indicated    General  Date of Onset: 12/31/10 Type of Study: Bedside swallow evaluation Diet Prior to this Study: Regular;Thin liquids Behavior/Cognition: Alert Oral Cavity - Dentition: Adequate natural  dentition Patient Positioning: Upright in bed Baseline Vocal Quality: Normal Volitional Cough: Strong Volitional Swallow: Able to elicit  Oral Motor/Sensory Function  Labial ROM: Within Functional Limits Labial Symmetry: Within Functional Limits Labial Strength: Within Functional Limits Labial Sensation: Within Functional Limits Lingual ROM: Within Functional Limits Lingual Symmetry: Within Functional Limits Lingual Strength: Within Functional Limits Lingual Sensation: Within Functional Limits Facial ROM: Within Functional Limits Facial Symmetry: Within Functional Limits Facial Strength: Within Functional Limits Facial Sensation: Within Functional Limits Velum: Within Functional Limits Mandible: Within Functional Limits  Consistency Results  Ice Chips Ice chips: Not tested  Thin Liquid Thin Liquid: Within functional limits Presentation: Cup;Self Fed  Nectar Thick Liquid Nectar Thick Liquid: Not tested  Honey Thick Liquid Honey Thick Liquid: Not tested  Puree Puree: Not tested  Solid Solid: Within functional limits Presentation: Self Fed Other Comments: pt masticated thoroughly which caregiver states is baseline x2 years   Chales Abrahams 12/31/2010,1:39 PM

## 2011-01-01 LAB — BASIC METABOLIC PANEL
CO2: 25 mEq/L (ref 19–32)
Calcium: 8.7 mg/dL (ref 8.4–10.5)
Creatinine, Ser: 0.76 mg/dL (ref 0.50–1.10)
GFR calc non Af Amer: 75 mL/min — ABNORMAL LOW (ref >90)
Glucose, Bld: 79 mg/dL (ref 70–99)
Sodium: 138 mEq/L (ref 135–145)

## 2011-01-01 LAB — MAGNESIUM: Magnesium: 2.1 mg/dL (ref 1.5–2.5)

## 2011-01-01 LAB — CBC
HCT: 41.8 % (ref 36.0–46.0)
MCHC: 33.5 g/dL (ref 30.0–36.0)
MCV: 93.9 fL (ref 78.0–100.0)
Platelets: 101 10*3/uL — ABNORMAL LOW (ref 150–400)
RDW: 13.2 % (ref 11.5–15.5)
WBC: 3.8 10*3/uL — ABNORMAL LOW (ref 4.0–10.5)

## 2011-01-01 MED ORDER — ASPIRIN 81 MG PO TABS: 81.0000 mg | ORAL_TABLET | Freq: Every day | ORAL | Status: AC

## 2011-01-01 NOTE — Progress Notes (Signed)
PROVIDED PATIENT/DTR W/LIFE LINE AS RESOURCE.HAS [employer].DTR WILL LOOK INTO ADDITIONAL HOURS FROM [employer] FOR NIGHT TIME.PATIENT HAS PCP,& PHARMACY.

## 2011-01-01 NOTE — Progress Notes (Signed)
Physical Therapy Evaluation Patient Details Name: Michelle Savage MRN: [id number] DOB: Mar 06, 1924 Today's Date: 01/01/2011 Time: [id number]  Eval I Problem List:  Patient Active Problem List  Diagnoses  . HYPERTENSION  . TIA (transient ischemic attack)  . Dementia  . Hyperglycemia    Past Medical History:  Past Medical History  Diagnosis Date  . Alzheimer disease   . Depression   . Hypertension    Past Surgical History:  Past Surgical History  Procedure Date  . Cholecystectomy     PT Assessment/Plan/Recommendation PT Assessment Clinical Impression Statement: Pt presents with diagnosis of TIA. Pt is mobilizing at supervision-modified independent level. Do not feel pt has any physical therapy needs at this time. PT will sign off. If family has some concerns about safety at night/wandering, they need to consider 24 hour supervision.  PT Recommendation/Assessment: Patient does not need any further PT services. No Skilled PT: Patient will have necessary level of assist by caregiver at discharge (during the day);Patient is supervision for all activity/mobility;Other (comment) (pt is near baseline) PT Goals     PT Evaluation Precautions/Restrictions  Restrictions Weight Bearing Restrictions: No Prior Functioning  Home Living Lives With: Alone;Other (Comment) (with personal sitter from 8-6) Receives Help From: Personal care attendant Type of Home: House Home Layout: One level Home Access: Stairs to enter Entrance Stairs-Rails: Right Entrance Stairs-Number of Steps: 2 Home Adaptive Equipment: None Prior Function Level of Independence: Independent with basic ADLs;Independent with gait;Other (comment) (Supervision for safety due to dementia) Driving: No Cognition Cognition Arousal/Alertness: Awake/alert Overall Cognitive Status: Impaired Attention: Impaired Memory: Appears impaired Memory Deficits: Impaired short-term memory. Orientation Level: Oriented to  person;Oriented to place;Disoriented to time;Disoriented to situation Following Commands: Follows multi-step commands inconsistently Sensation/Coordination Coordination Gross Motor Movements are Fluid and Coordinated: Yes Extremity Assessment RLE Assessment RLE Assessment: Within Functional Limits LLE Assessment LLE Assessment: Within Functional Limits Mobility (including Balance) Bed Mobility Bed Mobility: Yes Supine to Sit: 6: Modified independent (Device/Increase time) Sit to Supine - Right: 6: Modified independent (Device/Increase time) Transfers Transfers: Yes Sit to Stand: 5: Supervision Stand to Sit: 6: Modified independent (Device/Increase time) Ambulation/Gait Ambulation/Gait: Yes Ambulation/Gait Assistance: 5: Supervision Ambulation/Gait Assistance Details (indicate cue type and reason): Slightly unsteady with intermittent stumbling, but no LOB. Ambulation Distance (Feet): 300 Feet Assistive device: None  Posture/Postural Control Posture/Postural Control: No significant limitations Balance Balance Assessed: Yes Static Standing Balance Static Standing - Comment/# of Minutes: Had pt stand statically with eyes open/closed with normal BOS and narrow BOS - no difficulty, no LOB. Dynamic Standing Balance Dynamic Standing - Comments: Had pt perform 360 degree turn, P/u object - no LOB. Pt needed 1 hand support on bed while picking object from floor.  Exercise  Other Exercises Other Exercises: Pt able to negotiate 2 steps with use of 1 handrail with supervision level assist.  End of Session PT - End of Session Equipment Utilized During Treatment: Gait belt Activity Tolerance: Patient tolerated treatment well Patient left: in bed;with call bell in reach;with family/visitor present General Behavior During Session: [facility] for tasks performed Cognition: Impaired, at baseline  Rebeca [facility] 01/01/2011, 9:31 AM

## 2011-01-01 NOTE — Discharge Summary (Signed)
Physician Discharge Summary  Michelle Savage ZOX:[id number] DOB: Jul 10, 1924 DOA: 12/30/2010  PCP: [name], M.D. Neurologist: Marlan Palau, M.D. Admit date: 12/30/2010 Discharge date: 01/01/2011  Discharge Diagnoses:  #1: TIA #2: Vomiting, resolved. #3: Aspiration pneumonitis without evidence of infection. #4: Thrombocytopenia #5: Dementia, stable.  Consultants:  Speech therapy: Regular diet, thin liquids. Able to feed self.  Physical therapy: No outpatient recommendations. No need for occupational therapy assessment.  Procedures:  #1: Bilateral carotid Dopplers performed. Results showed no ICA stenosis.  #2: 2-D echocardiogram: Left ventricle: The cavity size was normal. Wall thickness was normal. Systolic function was normal. The estimated ejection fraction was in the range of 55% to 60%. Wall motion was normal; there were no regional wall motion abnormalities. Doppler parameters are consistent with abnormal left ventricular relaxation (grade 1 diastolic dysfunction).  Discharge Condition: Improved  Disposition: Home.  Hospital Course:  76 year old woman who was noted at home to have vomiting and dysarthria and leaning to one side upon awakening approximately 7 AM on day of admission. Per emergency physician documentation it was reported that the patient had left facial droop and slurred speech. Per emergency physician exam the face appeared symmetric and speech was clear. There is no dysdiadochokinesis. Normal strength in the upper and lower extremities was documented.  Per admission documentation the family felt the patient was at baseline on admission. Patient was admitted for possible TIA, hyperglycemia without a history of diabetes, vomiting.   Patient had no further neurologic issues and has returned completely to baseline. She was evaluated by physical therapy and is not felt to have any outpatient needs. She was not previously on aspirin and so this was added to  her regimen. Her LDL is at goal without therapy. Blood pressures at goal without therapy. She was not a candidate for TPA secondary to unknown duration of symptoms.  Chest x-ray suggested an aspiration pneumonitis. Patient has no shortness of breath. No hypoxia. Speech therapy evaluation was conducted. No aspiration noted.  Discharge Orders    Future Orders Please Complete By Expires   Diet - low sodium heart healthy      Discharge instructions      Comments:   Call physician for slurred speech, weakness or neurologic changes.   Increase activity slowly        Current Discharge Medication List    START taking these medications   Details  aspirin 81 MG tablet Take 1 tablet (81 mg total) by mouth daily.      CONTINUE these medications which have NOT CHANGED   Details  Calcium-Vitamin D 600-200 MG-UNIT per tablet Take 1 tablet by mouth daily.      donepezil (ARICEPT) 10 MG tablet Take 10 mg by mouth daily.     fish oil-omega-3 fatty acids 1000 MG capsule Take 1 g by mouth daily.     memantine (NAMENDA) 10 MG tablet Take 10 mg by mouth 2 (two) times daily.      sertraline (ZOLOFT) 25 MG tablet Take 25 mg by mouth every evening.         Follow-up Information    Follow up with SHAW,KIMBERLEE in 2 weeks.   Contact information:   [address] [place] [address] [phone]       Follow up with Lesly Dukes, MD. (As needed)    Contact information:   [address], [address] [address] [facility] [place] [address] [phone]  Things to follow up in the outpatient setting: Thrombocytopenia. Unclear significance. Consider repeat CBC in the outpatient setting.  Time coordinating discharge: 35 minutes.  The results of significant diagnostics from this hospitalization (including imaging, microbiology, ancillary and laboratory) are listed below for reference.    Significant Diagnostic  Studies: Dg Chest 2 View  12/31/2010  *RADIOLOGY REPORT*  Clinical Data: Aspiration.  Weakness and cough.  CHEST - 2 VIEW  Comparison: 12/30/2010  Findings: Lateral view degraded by patient positioning and overlying arms.  Midline trachea.  Mild cardiomegaly with atherosclerosis in the transverse aorta. No pleural effusion or pneumothorax.  Right middle lobe airspace disease is improved but persistent.  A probable button artifact projects over the right upper lobe.  There is also a calcified granuloma.  IMPRESSION:  1.  Improved right middle lobe airspace disease, likely infection or aspiration. 2.  Probable button artifact over the right upper lobe. Recommend attention on follow-up. 3. Cardiomegaly without congestive failure.  Original Report Authenticated By: Consuello Bossier, M.D.   Dg Chest 2 View  12/30/2010  *RADIOLOGY REPORT*  Clinical Data: 77 year old female with weakness, memory loss, TIA.  CHEST - 2 VIEW  Comparison: None.  Findings: Semi upright AP and lateral views of the chest. Confluent airspace disease in the right middle lobe.  Cardiac size at the upper limits of normal. Other mediastinal contours are within normal limits.  Visualized tracheal air column is within normal limits.  Left lung and upper lobes appear clear. No acute osseous abnormality identified.    IMPRESSION: Right middle lobe pneumonia or aspiration.  Post-treatment radiographs recommended to document resolution.  Original Report Authenticated By: Harley Hallmark, M.D.   Ct Head Wo Contrast  12/30/2010  *RADIOLOGY REPORT*  Clinical Data: Altered level of consciousness, emesis,  memory loss  CT HEAD WITHOUT CONTRAST  Technique:  Contiguous axial images were obtained from the base of the skull through the vertex without contrast.  Comparison: MRI scan of Jun 27, 2007  Findings: Bony calvarium appears intact.  No mass effect or midline shift is noted.  Ventricular size is within normal limits.  Chronic ischemic white matter  disease is noted in the periventricular region.  There is no evidence of mass, hemorrhage or acute infarction.  IMPRESSION: Chronic ischemic white matter disease.  No acute intracranial abnormality seen.  Original Report Authenticated By: Venita Sheffield., M.D.   Mr Angiogram Head Wo Contrast  12/31/2010  *RADIOLOGY REPORT*  Clinical Data:  TIA.  Slurred speech and confusion  MRI HEAD WITHOUT CONTRAST MRA HEAD WITHOUT CONTRAST  Technique:  Multiplanar, multiecho pulse sequences of the brain and surrounding structures were obtained without intravenous contrast. Angiographic images of the head were obtained using MRA technique without contrast.  Comparison:  CT 12/30/2010, MRI 06/27/2007  MRI HEAD  Findings:  Negative for acute infarct.  Mild hyperintensity in the cerebral white matter is unchanged from the  prior study compatible with  chronic microvascular ischemia.  Brainstem and cerebellum are intact.  Age appropriate atrophy.  Negative for hydrocephalus.  Negative for hemorrhage or mass lesion.  IMPRESSION: Atrophy and mild chronic microvascular ischemia.  No acute abnormality.  MRA HEAD  Findings: Both vertebral arteries are patent to   pica.  The basilar is a very small vessel appears that to be congenitally small.  There is fetal origin of the posterior cerebral arteries bilaterally.  Prior MRI also showed a small basilar however MRA was not performed on the prior study.  Superior  cerebellar and posterior cerebral arteries are patent bilaterally.  Moderate stenosis in the right posterior cerebral artery.  Internal carotid artery is patent bilaterally without stenosis. Anterior and middle cerebral arteries are patent bilaterally.  Negative for cerebral aneurysm.  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  Original Report Authenticated By: Camelia Phenes, M.D.   Mr Brain Wo Contrast  12/31/2010  *RADIOLOGY  REPORT*  Clinical Data:  TIA.  Slurred speech and confusion  MRI HEAD WITHOUT CONTRAST MRA HEAD WITHOUT CONTRAST  Technique:  Multiplanar, multiecho pulse sequences of the brain and surrounding structures were obtained without intravenous contrast. Angiographic images of the head were obtained using MRA technique without contrast.  Comparison:  CT 12/30/2010, MRI 06/27/2007  MRI HEAD  Findings:  Negative for acute infarct.  Mild hyperintensity in the cerebral white matter is unchanged from the  prior study compatible with  chronic microvascular ischemia.  Brainstem and cerebellum are intact.  Age appropriate atrophy.  Negative for hydrocephalus.  Negative for hemorrhage or mass lesion.  IMPRESSION: Atrophy and mild chronic microvascular ischemia.  No acute abnormality.  MRA HEAD  Findings: Both vertebral arteries are patent to   pica.  The basilar is a very small vessel appears that to be congenitally small.  There is fetal origin of the posterior cerebral arteries bilaterally.  Prior MRI also showed a small basilar however MRA was not performed on the prior study.  Superior cerebellar and posterior cerebral arteries are patent bilaterally.  Moderate stenosis in the right posterior cerebral artery.  Internal carotid artery is patent bilaterally without stenosis. Anterior and middle cerebral arteries are patent bilaterally.  Negative for cerebral aneurysm.  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  Original Report Authenticated By: Camelia Phenes, M.D.   Labs: Results for orders placed during the hospital encounter of 12/30/10 (from the past 48 hour(s))  PROTIME-INR     Status: Normal   Collection Time   12/30/10 12:30 PM      Component Value Range Comment   Prothrombin Time 13.8  11.6 - 15.2 (seconds)    INR 1.04  0.00 - 1.49    APTT     Status: Normal   Collection Time   12/30/10 12:30 PM      Component Value  Range Comment   aPTT 30  24 - 37 (seconds)   CBC     Status: Abnormal   Collection Time   12/30/10 12:30 PM      Component Value Range Comment   WBC 10.9 (*) 4.0 - 10.5 (K/uL)    RBC 4.92  3.87 - 5.11 (MIL/uL)    Hemoglobin 15.7 (*) 12.0 - 15.0 (g/dL)    HCT 29.5 (*) 62.1 - 46.0 (%)    MCV 94.1  78.0 - 100.0 (fL)    MCH 31.9  26.0 - 34.0 (pg)    MCHC 33.9  30.0 - 36.0 (g/dL)    RDW 30.8  65.7 - 84.6 (%)    Platelets 129 (*) 150 - 400 (K/uL)   DIFFERENTIAL     Status: Abnormal   Collection Time   12/30/10 12:30 PM      Component Value Range Comment   Neutrophils Relative 96 (*) 43 - 77 (%)    Neutro Abs 10.4 (*) 1.7 - 7.7 (K/uL)    Lymphocytes Relative 1 (*)  12 - 46 (%)    Lymphs Abs 0.1 (*) 0.7 - 4.0 (K/uL)    Monocytes Relative 3  3 - 12 (%)    Monocytes Absolute 0.4  0.1 - 1.0 (K/uL)    Eosinophils Relative 0  0 - 5 (%)    Eosinophils Absolute 0.0  0.0 - 0.7 (K/uL)    Basophils Relative 0  0 - 1 (%)    Basophils Absolute 0.0  0.0 - 0.1 (K/uL)   CK TOTAL AND CKMB     Status: Normal   Collection Time   12/30/10 12:30 PM      Component Value Range Comment   Total CK 105  7 - 177 (U/L)    CK, MB 2.6  0.3 - 4.0 (ng/mL)    Relative Index 2.5  0.0 - 2.5    TROPONIN I     Status: Normal   Collection Time   12/30/10 12:30 PM      Component Value Range Comment   Troponin I <0.30  <0.30 (ng/mL)   BASIC METABOLIC PANEL     Status: Abnormal   Collection Time   12/30/10 12:30 PM      Component Value Range Comment   Sodium 141  135 - 145 (mEq/L)    Potassium 3.7  3.5 - 5.1 (mEq/L)    Chloride 102  96 - 112 (mEq/L)    CO2 26  19 - 32 (mEq/L)    Glucose, Bld 200 (*) 70 - 99 (mg/dL)    BUN 35 (*) 6 - 23 (mg/dL)    Creatinine, Ser 4.54  0.50 - 1.10 (mg/dL)    Calcium 9.7  8.4 - 10.5 (mg/dL)    GFR calc non Af Amer 59 (*) >90 (mL/min)    GFR calc Af Amer 68 (*) >90 (mL/min)   URINALYSIS, ROUTINE W REFLEX MICROSCOPIC     Status: Abnormal   Collection Time   12/30/10  2:25 PM       Component Value Range Comment   Color, Urine YELLOW  YELLOW     Appearance CLOUDY (*) CLEAR     Specific Gravity, Urine 1.018  1.005 - 1.030     pH 5.5  5.0 - 8.0     Glucose, UA NEGATIVE  NEGATIVE (mg/dL)    Hgb urine dipstick NEGATIVE  NEGATIVE     Bilirubin Urine NEGATIVE  NEGATIVE     Ketones, ur NEGATIVE  NEGATIVE (mg/dL)    Protein, ur NEGATIVE  NEGATIVE (mg/dL)    Urobilinogen, UA 1.0  0.0 - 1.0 (mg/dL)    Nitrite NEGATIVE  NEGATIVE     Leukocytes, UA NEGATIVE  NEGATIVE  MICROSCOPIC NOT DONE ON URINES WITH NEGATIVE PROTEIN, BLOOD, LEUKOCYTES, NITRITE, OR GLUCOSE <1000 mg/dL.  HEMOGLOBIN A1C     Status: Normal   Collection Time   12/30/10  6:45 PM      Component Value Range Comment   Hemoglobin A1C 5.3  <5.7 (%)    Mean Plasma Glucose 105  <117 (mg/dL)   GLUCOSE, CAPILLARY     Status: Abnormal   Collection Time   12/30/10  9:02 PM      Component Value Range Comment   Glucose-Capillary 112 (*) 70 - 99 (mg/dL)    Comment 1 Documented in Chart      Comment 2 Notify RN     LIPID PANEL     Status: Normal   Collection Time   12/31/10  5:01 AM  Component Value Range Comment   Cholesterol 138  0 - 200 (mg/dL)    Triglycerides 69  <161 (mg/dL)    HDL 57  >09 (mg/dL)    Total CHOL/HDL Ratio 2.4      VLDL 14  0 - 40 (mg/dL)    LDL Cholesterol 67  0 - 99 (mg/dL)   CBC     Status: Abnormal   Collection Time   12/31/10  5:01 AM      Component Value Range Comment   WBC 6.2  4.0 - 10.5 (K/uL)    RBC 4.33  3.87 - 5.11 (MIL/uL)    Hemoglobin 13.3  12.0 - 15.0 (g/dL)    HCT 60.4  54.0 - 98.1 (%)    MCV 94.0  78.0 - 100.0 (fL)    MCH 30.7  26.0 - 34.0 (pg)    MCHC 32.7  30.0 - 36.0 (g/dL)    RDW 19.1  47.8 - 29.5 (%)    Platelets 119 (*) 150 - 400 (K/uL)   BASIC METABOLIC PANEL     Status: Abnormal   Collection Time   12/31/10  5:01 AM      Component Value Range Comment   Sodium 137  135 - 145 (mEq/L)    Potassium 2.9 (*) 3.5 - 5.1 (mEq/L)    Chloride 102  96 - 112  (mEq/L)    CO2 26  19 - 32 (mEq/L)    Glucose, Bld 93  70 - 99 (mg/dL)    BUN 27 (*) 6 - 23 (mg/dL)    Creatinine, Ser 6.21  0.50 - 1.10 (mg/dL)    Calcium 8.2 (*) 8.4 - 10.5 (mg/dL)    GFR calc non Af Amer 76 (*) >90 (mL/min)    GFR calc Af Amer 88 (*) >90 (mL/min)   GLUCOSE, CAPILLARY     Status: Abnormal   Collection Time   12/31/10  7:46 AM      Component Value Range Comment   Glucose-Capillary 101 (*) 70 - 99 (mg/dL)   BASIC METABOLIC PANEL     Status: Abnormal   Collection Time   01/01/11  4:20 AM      Component Value Range Comment   Sodium 138  135 - 145 (mEq/L)    Potassium 4.1  3.5 - 5.1 (mEq/L)    Chloride 106  96 - 112 (mEq/L)    CO2 25  19 - 32 (mEq/L)    Glucose, Bld 79  70 - 99 (mg/dL)    BUN 20  6 - 23 (mg/dL)    Creatinine, Ser 3.08  0.50 - 1.10 (mg/dL)    Calcium 8.7  8.4 - 10.5 (mg/dL)    GFR calc non Af Amer 75 (*) >90 (mL/min)    GFR calc Af Amer 87 (*) >90 (mL/min)   MAGNESIUM     Status: Normal   Collection Time   01/01/11  4:20 AM      Component Value Range Comment   Magnesium 2.1  1.5 - 2.5 (mg/dL)   CBC     Status: Abnormal   Collection Time   01/01/11  4:20 AM      Component Value Range Comment   WBC 3.8 (*) 4.0 - 10.5 (K/uL)    RBC 4.45  3.87 - 5.11 (MIL/uL)    Hemoglobin 14.0  12.0 - 15.0 (g/dL)    HCT 65.7  84.6 - 96.2 (%)    MCV 93.9  78.0 - 100.0 (fL)  MCH 31.5  26.0 - 34.0 (pg)    MCHC 33.5  30.0 - 36.0 (g/dL)    RDW 16.1  09.6 - 04.5 (%)    Platelets 101 (*) 150 - 400 (K/uL) CONSISTENT WITH PREVIOUS RESULT     Signed: Pandora Leiter MD 01/01/2011, 11:33 AM

## 2011-01-01 NOTE — Progress Notes (Signed)
PROGRESS NOTE  Michelle Savage WUJ:[id number] DOB: 02/08/1925 DOA: 12/30/2010  Subjective: No new issues per nurse. Doing quite well. Caregiver feels patient is at baseline.  Objective: Filed Vitals:   01/01/11 1009  BP: 110/68  Pulse: 65  Temp: 98.2 F (36.8 C)  Resp: 18   Blood pressure 110/68, pulse 65, temperature 98.2 F (36.8 C), temperature source Oral, resp. rate 18, height 5\' 2"  (1.575 m), weight 69.3 kg (152 lb 12.5 oz), SpO2 94.00%.   Intake/Output Summary (Last 24 hours) at 01/01/11 1103 Last data filed at 01/01/11 0900  Gross per 24 hour  Intake    480 ml  Output    300 ml  Net    180 ml    Exam: General: Appears well. Cardiovascular: Regular rate and rhythm. No murmur, rub or gallop. Respiratory: Clear to auscultation bilaterally. No wheezes, rales or rhonchi. Normal respiratory effort Neurologic: Cranial nerves 2-12 intact. Speech fluent and clear. Musculoskeletal: Tone and strength in the upper lower extremities appears to be normal. Symmetric.  Telemetry: Sinus rhythm, no arrhythmias.   Exam current as of November 15.   Assessment/Plan: TIA: Neurologic symptoms resolved. MRI of the brain: no acute abnormality. MRA of the head: Moderate stenosis right posterior cerebral artery. LDL 67. Complete 2-D echocardiogram and carotid ultrasound as well as physical and occupational therapy consultations. The patient was not on aspirin at home therefore will add an aspirin. LDL does not meet criteria for anti-lipid therapy and blood pressure stable without medication. Vomiting: Resolved. No pain. Probably related to TIA. Abnormal chest x-ray: Admission chest x-ray was notable for right middle lobe aspiration or pneumonia. Patient's lungs are clear on examination. She requires no oxygen and her respiratory effort is normal. Repeat chest x-ray shows improvement in suspected aspiration pneumonitis. Hyperglycemia: Fasting blood sugar 93. Hemoglobin A1c 5.3. No further  evaluation. Discontinue sliding scale and glucose checks. Hypokalemia: Resolved. Thrombocytopenia: Followup in the outpatient setting. Dementia: Progressing per family. Continue Aricept and Namenda.  Hypertension: Currently not on any outpatient antihypertensives.  I spoke with her daughter Glendon Axe by phone today. All questions answered to her satisfaction.  Code Status:DO NOT RESUSCITATE  Family Communication: [name], [phone].  Disposition Plan: Home today    PCP: [name], M.D. Neurologist: Lesia Sago, M.D. (dementia)   LOS: 2 days   Pandora Leiter MD  Brief narrative: 77 year old woman who was noted at home to have vomiting and dysarthria and leaning to one side upon awakening approximately 7 AM on day of admission. Per emergency physician documentation it was reported that the patient had left facial droop and slurred speech. Per emergency physician exam the face appeared symmetric and speech was clear. There is no dysdiadochokinesis. Normal strength in the upper and lower extremities was documented.  Per admission documentation the family felt the patient was at baseline on admission. Patient was admitted for possible TIA, hyperglycemia without a history of diabetes, vomiting.  Consultants: Speech therapy: Regular diet, thin liquids. Able to feed self. Physical therapy: No outpatient recommendations.  Procedures: #1: Bilateral carotid Dopplers performed. Results showed no ICA stenosis. #2: 2-D echocardiogram: Left ventricle: The cavity size was normal. Wall thickness was normal. Systolic function was normal. The estimated ejection fraction was in the range of 55% to 60%. Wall motion was normal; there were no regional wall motion abnormalities. Doppler parameters are consistent with abnormal left ventricular relaxation (grade 1 diastolic dysfunction).  Past medical history: Alzheimer's dementia (progressing, per daughter, wandering noted) , depression.  Has an  aide that stays during the day.   Data Reviewed: Results for orders placed during the hospital encounter of 12/30/10 (from the past 24 hour(s))  BASIC METABOLIC PANEL     Status: Abnormal   Collection Time   01/01/11  4:20 AM      Component Value Range   Sodium 138  135 - 145 (mEq/L)   Potassium 4.1  3.5 - 5.1 (mEq/L)   Chloride 106  96 - 112 (mEq/L)   CO2 25  19 - 32 (mEq/L)   Glucose, Bld 79  70 - 99 (mg/dL)   BUN 20  6 - 23 (mg/dL)   Creatinine, Ser 1.61  0.50 - 1.10 (mg/dL)   Calcium 8.7  8.4 - 09.6 (mg/dL)   GFR calc non Af Amer 75 (*) >90 (mL/min)   GFR calc Af Amer 87 (*) >90 (mL/min)  MAGNESIUM     Status: Normal   Collection Time   01/01/11  4:20 AM      Component Value Range   Magnesium 2.1  1.5 - 2.5 (mg/dL)  CBC     Status: Abnormal   Collection Time   01/01/11  4:20 AM      Component Value Range   WBC 3.8 (*) 4.0 - 10.5 (K/uL)   RBC 4.45  3.87 - 5.11 (MIL/uL)   Hemoglobin 14.0  12.0 - 15.0 (g/dL)   HCT 04.5  40.9 - 81.1 (%)   MCV 93.9  78.0 - 100.0 (fL)   MCH 31.5  26.0 - 34.0 (pg)   MCHC 33.5  30.0 - 36.0 (g/dL)   RDW 91.4  78.2 - 95.6 (%)   Platelets 101 (*) 150 - 400 (K/uL)    No results found for this or any previous visit (from the past 240 hour(s)).   Studies/Results: Dg Chest 2 View  12/30/2010  *RADIOLOGY REPORT*  Clinical Data: 77 year old female with weakness, memory loss, TIA.  CHEST - 2 VIEW  Comparison: None.  Findings: Semi upright AP and lateral views of the chest. Confluent airspace disease in the right middle lobe.  Cardiac size at the upper limits of normal. Other mediastinal contours are within normal limits.  Visualized tracheal air column is within normal limits.  Left lung and upper lobes appear clear. No acute osseous abnormality identified.    IMPRESSION: Right middle lobe pneumonia or aspiration.  Post-treatment radiographs recommended to document resolution.  Original Report Authenticated By: Harley Hallmark, M.D.   Ct Head Wo  Contrast  12/30/2010  *RADIOLOGY REPORT*  Clinical Data: Altered level of consciousness, emesis,  memory loss  CT HEAD WITHOUT CONTRAST  Technique:  Contiguous axial images were obtained from the base of the skull through the vertex without contrast.  Comparison: MRI scan of Jun 27, 2007  Findings: Bony calvarium appears intact.  No mass effect or midline shift is noted.  Ventricular size is within normal limits.  Chronic ischemic white matter disease is noted in the periventricular region.  There is no evidence of mass, hemorrhage or acute infarction.  IMPRESSION: Chronic ischemic white matter disease.  No acute intracranial abnormality seen.  Original Report Authenticated By: Venita Sheffield., M.D.   Mr Angiogram Head Wo Contrast  12/31/2010  *RADIOLOGY REPORT*  Clinical Data:  TIA.  Slurred speech and confusion  MRI HEAD WITHOUT CONTRAST MRA HEAD WITHOUT CONTRAST  Technique:  Multiplanar, multiecho pulse sequences of the brain and surrounding structures were obtained without intravenous contrast. Angiographic images of the head were obtained using  MRA technique without contrast.  Comparison:  CT 12/30/2010, MRI 06/27/2007  MRI HEAD  Findings:  Negative for acute infarct.  Mild hyperintensity in the cerebral white matter is unchanged from the  prior study compatible with  chronic microvascular ischemia.  Brainstem and cerebellum are intact.  Age appropriate atrophy.  Negative for hydrocephalus.  Negative for hemorrhage or mass lesion.  IMPRESSION: Atrophy and mild chronic microvascular ischemia.  No acute abnormality.  MRA HEAD  Findings: Both vertebral arteries are patent to   pica.  The basilar is a very small vessel appears that to be congenitally small.  There is fetal origin of the posterior cerebral arteries bilaterally.  Prior MRI also showed a small basilar however MRA was not performed on the prior study.  Superior cerebellar and posterior cerebral arteries are patent bilaterally.  Moderate  stenosis in the right posterior cerebral artery.  Internal carotid artery is patent bilaterally without stenosis. Anterior and middle cerebral arteries are patent bilaterally.  Negative for cerebral aneurysm.  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  Original Report Authenticated By: Camelia Phenes, M.D.   Mr Brain Wo Contrast  12/31/2010  *RADIOLOGY REPORT*  Clinical Data:  TIA.  Slurred speech and confusion  MRI HEAD WITHOUT CONTRAST MRA HEAD WITHOUT CONTRAST  Technique:  Multiplanar, multiecho pulse sequences of the brain and surrounding structures were obtained without intravenous contrast. Angiographic images of the head were obtained using MRA technique without contrast.  Comparison:  CT 12/30/2010, MRI 06/27/2007  MRI HEAD  Findings:  Negative for acute infarct.  Mild hyperintensity in the cerebral white matter is unchanged from the  prior study compatible with  chronic microvascular ischemia.  Brainstem and cerebellum are intact.  Age appropriate atrophy.  Negative for hydrocephalus.  Negative for hemorrhage or mass lesion.  IMPRESSION: Atrophy and mild chronic microvascular ischemia.  No acute abnormality.  MRA HEAD  Findings: Both vertebral arteries are patent to   pica.  The basilar is a very small vessel appears that to be congenitally small.  There is fetal origin of the posterior cerebral arteries bilaterally.  Prior MRI also showed a small basilar however MRA was not performed on the prior study.  Superior cerebellar and posterior cerebral arteries are patent bilaterally.  Moderate stenosis in the right posterior cerebral artery.  Internal carotid artery is patent bilaterally without stenosis. Anterior and middle cerebral arteries are patent bilaterally.  Negative for cerebral aneurysm.  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  Moderate stenosis right  posterior cerebral artery.  Original Report Authenticated By: Camelia Phenes, M.D.     Scheduled Meds:    . aspirin  325 mg Oral Daily  . donepezil  10 mg Oral Daily  . enoxaparin  40 mg Subcutaneous Q24H  . memantine  10 mg Oral BID  . potassium chloride  40 mEq Oral TID  . sertraline  25 mg Oral QPM  . sodium chloride  3 mL Intravenous Q12H   Continuous Infusions:

## 2011-01-01 NOTE — Progress Notes (Signed)
INITIAL ADULT NUTRITION ASSESSMENT Date: 01/01/2011   Time: 12:53 PM Reason for Assessment: Health Hx  ASSESSMENT: Female 77 y.o.  Dx: TIA (transient ischemic attack), hyperglycemia, dementia  Hx:  Past Medical History  Diagnosis Date  . Alzheimer disease   . Depression   . Hypertension     Scheduled Meds:    . aspirin  325 mg Oral Daily  . donepezil  10 mg Oral Daily  . enoxaparin  40 mg Subcutaneous Q24H  . memantine  10 mg Oral BID  . potassium chloride  40 mEq Oral TID  . sertraline  25 mg Oral QPM  . sodium chloride  3 mL Intravenous Q12H   Continuous Infusions:  PRN Meds:.acetaminophen, ondansetron (ZOFRAN) IV   Ht: 5\' 2"  (157.5 cm)  Wt: 152 lb 12.5 oz (69.3 kg)  Ideal Wt: 110 lb (50 kg) % Ideal Wt: 138%  Usual Wt: 153 lb % Usual Wt: 100%  Body mass index is 27.94 kg/(m^2).  Food/Nutrition Related Hx: pt reports >10 lb weight loss in last month, per health hx Pt caregiver reports that pt has been eating 3 meals a day and actually gained weight PTA, but has not been eating much since admission. Pt's lunch was in the room, but pt stated she was not hungry. Pt stated it seemed like she had just eaten, but caregiver stated pt had only eaten a piece of bacon this morning for breakfast. Caregiver reports that pt will usually always eat ice cream, but right now pt will not eat that either. Caregiver cooks for pt at home, usually baking food items or using olive oil.  Labs:  CMP     Component Value Date/Time   NA 138 01/01/2011 0420   K 4.1 01/01/2011 0420   CL 106 01/01/2011 0420   CO2 25 01/01/2011 0420   GLUCOSE 79 01/01/2011 0420   BUN 20 01/01/2011 0420   CREATININE 0.76 01/01/2011 0420   CALCIUM 8.7 01/01/2011 0420   GFRNONAA 75* 01/01/2011 0420   GFRAA 87* 01/01/2011 0420    CBG (last 3)   Basename 12/31/10 0746 12/30/10 2102  GLUCAP 101* 112*   Hemoglobin A1c: 5.3%  I/O last 3 completed shifts: In: 360 [P.O.:360] Out: 600  [Urine:600] Total I/O In: 120 [P.O.:120] Out: -    Diet Order: Carb Control (25% PO intake recorded)   Estimated Nutritional Needs:   Kcal: 1550-1750 Protein: 65- 75 gm Fluid: >2.0 L  Bedside swallow study on 12/31/10- recommended regular consistency with thin liquids  NUTRITION DIAGNOSIS: -Inadequate oral intake (NI-2.1).  Status: Ongoing  RELATED TO: decreased appetite and dementia  AS EVIDENCE BY: pt comments and 25% PO intake  MONITORING/EVALUATION(Goals): Goal: Pt to consume >50% meals. Monitor PO intake, weight changes  EDUCATION NEEDS: -Education not appropriate at this time  INTERVENTION: Encourage PO intake.   DOCUMENTATION CODES Per approved criteria  -Not Applicable    Leonette Most 01/01/2011, 12:53 PM

## 2012-08-26 ENCOUNTER — Telehealth: Payer: Self-pay | Admitting: Neurology

## 2012-08-29 NOTE — Telephone Encounter (Signed)
has a question about her mom's Sertraline. she feels her mom for the last week has been very biligerent and rude and unruly with her caregivers and she is not sleeping at night. wants to know if increasing the dose would help. Please advise [phone] is the best number to contact the daughter also her appointment to be seen is [phone]

## 2012-08-29 NOTE — Telephone Encounter (Signed)
I called the left the message for the daughter. The Zoloft can be increased in dose. In [facility],, the patient is on 50 mg of this medication, in [facility], the dose is 25 mg. I am not sure exactly what this patient is on currently. The patient has not been seen through this office since January 2013. She will need a revisit.

## 2012-09-02 ENCOUNTER — Telehealth: Payer: Self-pay | Admitting: Neurology

## 2012-09-05 NOTE — Telephone Encounter (Signed)
I was out of the office on 07/18 (Friday), and returned today (Monday).  I called Ms Michelle Savage back to clarify what dose the patient is taking.  Got no answer.  Left message.

## 2012-09-07 MED ORDER — SERTRALINE HCL 100 MG PO TABS: 100.0000 mg | ORAL_TABLET | Freq: Every day | ORAL | Status: DC

## 2012-09-07 NOTE — Telephone Encounter (Signed)
I called again.  Spoke with Ms Michelle Savage.  Says mom is taking Zoloft 50mg  currently and they would like the dose increased to something higher.  States the patient is very aggressive.  Patient has an appt 08/22, needs med changed before then.  She would like Rx called into [facility].  Daughter is currently traveling and said if we try to call her, she will likely not be able to answer the phone.  She will have the caregiver follow up with the pharmacy tomorrow.

## 2012-09-07 NOTE — Telephone Encounter (Signed)
I will increase the dose to 100 mg of Zoloft.

## 2012-09-14 ENCOUNTER — Other Ambulatory Visit: Payer: Self-pay

## 2012-09-14 ENCOUNTER — Telehealth: Payer: Self-pay | Admitting: Neurology

## 2012-09-14 MED ORDER — SERTRALINE HCL 100 MG PO TABS: 100.0000 mg | ORAL_TABLET | Freq: Every day | ORAL | Status: DC

## 2012-09-14 NOTE — Telephone Encounter (Signed)
Rx resent.

## 2012-10-05 ENCOUNTER — Encounter: Payer: Self-pay | Admitting: Neurology

## 2012-10-07 ENCOUNTER — Ambulatory Visit (INDEPENDENT_AMBULATORY_CARE_PROVIDER_SITE_OTHER): Payer: Medicare Other | Admitting: Neurology

## 2012-10-07 ENCOUNTER — Encounter: Payer: Self-pay | Admitting: Neurology

## 2012-10-07 VITALS — BP 118/71 | HR 69 | Ht 63.0 in | Wt 136.0 lb

## 2012-10-07 DIAGNOSIS — F0390 Unspecified dementia without behavioral disturbance: Secondary | ICD-10-CM

## 2012-10-07 DIAGNOSIS — R413 Other amnesia: Secondary | ICD-10-CM

## 2012-10-07 NOTE — Progress Notes (Signed)
Reason for visit: Dementia  Michelle Savage is an 79 y.o. female  History of present illness:  Michelle Savage is an 79 year old right-handed white female with a history of Alzheimer's disease. The patient is still living at home, and she has some aid an assistance that comes in from 9 AM to 9 PM. The patient is alone from 9 PM to 9 AM. The patient usually does not sleep well, and she oftentimes is decorating the house throughout the night. The patient will sleep in the morning, and oftentimes will not eat well for breakfast. The patient is on Aricept and Namenda. The patient will take some coconut oil in as well. The patient has had some weight loss, but she seems to be maintaining her weight fairly well recently. The patient does not operate a motor vehicle, and she needs no assistance for basic activities of daily living such as bathing, dressing, or feeding herself. The patient returns to this office for an evaluation. The patient was having some issues with agitation over the last several months, but an increase in the Zoloft dose has helped this.  Past Medical History  Diagnosis Date  . Alzheimer disease   . Depression   . Hypertension   . Anemia   . Aortic insufficiency   . Mitral valve regurgitation   . Vitamin D deficiency   . Memory loss     Past Surgical History  Procedure Laterality Date  . Cholecystectomy    . Tonsillectomy      Family History  Problem Relation Age of Onset  . Lung cancer Mother   . Dementia Sister   . Stroke Sister     Social history:  reports that she has never smoked. She has never used smokeless tobacco. She reports that she does not drink alcohol or use illicit drugs.    Allergies  Allergen Reactions  . Penicillins Rash    Medications:  Current Outpatient Prescriptions on File Prior to Visit  Medication Sig Dispense Refill  . aspirin 81 MG tablet Take 81 mg by mouth daily.      . memantine (NAMENDA) 10 MG tablet Take 10 mg by mouth 2  (two) times daily.        . sertraline (ZOLOFT) 100 MG tablet Take 1 tablet (100 mg total) by mouth daily.  30 tablet  2   No current facility-administered medications on file prior to visit.    ROS:  Out of a complete 14 system review of symptoms, the patient complains only of the following symptoms, and all other reviewed systems are negative.  Weight loss Hearing loss Memory disturbance, confusion Abnormal sleeping patterns Change in appetite  Disinterest in activities  Blood pressure 118/71, pulse 69, height 5\' 3"  (1.6 m), weight 136 lb (61.689 kg).  Physical Exam  General: The patient is alert and cooperative at the time of the examination.  Skin: No significant peripheral edema is noted.   Neurologic Exam  Mental status: Mini-Mental status examination done today shows a total score of 21/30. The patient is able to name 9 animals in 60 seconds.  Cranial nerves: Facial symmetry is present. Speech is normal, no aphasia or dysarthria is noted. Extraocular movements are full. Visual fields are full.  Motor: The patient has good strength in all 4 extremities.  Coordination: The patient has good finger-nose-finger and heel-to-shin bilaterally. Apraxia was noted with the use of the extremities.  Gait and station: The patient has a normal gait. Tandem gait is normal.  Romberg is negative. No drift is seen.  Reflexes: Deep tendon reflexes are symmetric.   Assessment/Plan:  One. Memory disturbance  2. Abnormal sleeping pattern  The patient is staying awake at night, and is sleepy during the day. The patient may be placed on melatonin at nighttime to help get her sleeping patterns back and face. The patient is on Aricept and Namenda, and she will remain on these medications. The patient followup in 6-8 months. The family will need to monitor the patient for safety issues.  Marlan Palau MD 10/08/2012 8:25 AM  [facility] [address] [place], [place] [address]  Phone [phone] Fax [phone]

## 2012-10-08 DIAGNOSIS — R413 Other amnesia: Secondary | ICD-10-CM | POA: Insufficient documentation

## 2012-10-14 ENCOUNTER — Telehealth: Payer: Self-pay | Admitting: Neurology

## 2012-10-18 MED ORDER — TRAZODONE HCL 50 MG PO TABS: 50.0000 mg | ORAL_TABLET | Freq: Every day | ORAL | Status: DC

## 2012-10-18 NOTE — Telephone Encounter (Signed)
I called and spoke to daughter.  They have tried melatonin 5mg  caps nightly and also the sertraline (giving in the daytime).  Still not able to go to sleep.  Questioning the use of trazodone for her if not using something else?  Please advise.

## 2012-10-18 NOTE — Telephone Encounter (Signed)
I called patient and I talked with the daughter. The patient is still having issues with sleep, I will give a trial on trazodone.

## 2013-02-23 ENCOUNTER — Telehealth: Payer: Self-pay | Admitting: *Deleted

## 2013-02-24 NOTE — Telephone Encounter (Signed)
I called the patient and I talked with the daughter. The patient has been on trazodone at 50 mg at night, but she still is not sleeping well. I have recommended increasing the dose to 100 mg at night if she is not having any adverse side effects on the drug.

## 2013-02-28 ENCOUNTER — Other Ambulatory Visit: Payer: Self-pay | Admitting: Neurology

## 2013-03-02 ENCOUNTER — Telehealth: Payer: Self-pay | Admitting: Neurology

## 2013-04-27 ENCOUNTER — Encounter: Payer: Self-pay | Admitting: Neurology

## 2013-04-27 ENCOUNTER — Ambulatory Visit (INDEPENDENT_AMBULATORY_CARE_PROVIDER_SITE_OTHER): Payer: Medicare Other | Admitting: Neurology

## 2013-04-27 VITALS — BP 119/70 | HR 70 | Wt 144.0 lb

## 2013-04-27 DIAGNOSIS — F0390 Unspecified dementia without behavioral disturbance: Secondary | ICD-10-CM

## 2013-04-27 DIAGNOSIS — R413 Other amnesia: Secondary | ICD-10-CM

## 2013-04-27 MED ORDER — ZOLPIDEM TARTRATE 5 MG PO TABS: 5.0000 mg | ORAL_TABLET | Freq: Every evening | ORAL | Status: DC | PRN

## 2013-04-27 NOTE — Progress Notes (Signed)
Reason for visit: Memory disorder  Michelle Savage is an 80 y.o. female  History of present illness:  Michelle Savage is an 80 year old right-handed white female with a history of a progressive memory disorder. The patient is on Aricept and she is taking Namenda and tolerating these medications well. The patient has a caretaker from 9 AM to 9 PM, but she is alone the rest of the time. The patient is up wandering the house, getting into drawers at night. The patient does not want to take a bath or shower in the morning, and she gets agitated with this almost daily. The patient is on Zoloft, and this is not completely helpful in reducing some of the agitation. The patient has not had any significant safety issues. The daughter has a web cam where the patient can be monitored during the evening hours. If the patient returns to the office today for an evaluation. No new medical issues have come up since last seen.  Past Medical History  Diagnosis Date  . Alzheimer disease   . Depression   . Hypertension   . Anemia   . Aortic insufficiency   . Mitral valve regurgitation   . Vitamin D deficiency   . Memory loss     Past Surgical History  Procedure Laterality Date  . Cholecystectomy    . Tonsillectomy      Family History  Problem Relation Age of Onset  . Lung cancer Mother   . Dementia Sister   . Stroke Sister     Social history:  reports that she has never smoked. She has never used smokeless tobacco. She reports that she does not drink alcohol or use illicit drugs.    Allergies  Allergen Reactions  . Penicillins Rash    Medications:  Current Outpatient Prescriptions on File Prior to Visit  Medication Sig Dispense Refill  . aspirin 81 MG tablet Take 81 mg by mouth daily.      Marland Kitchen. donepezil (ARICEPT) 23 MG TABS tablet Take 23 mg by mouth at bedtime.      . memantine (NAMENDA) 10 MG tablet Take 10 mg by mouth 2 (two) times daily.        . sertraline (ZOLOFT) 100 MG tablet  Take 1 tablet (100 mg total) by mouth daily.  30 tablet  2   No current facility-administered medications on file prior to visit.    ROS:  Out of a complete 14 system review of symptoms, the patient complains only of the following symptoms, and all other reviewed systems are negative.  Memory disorder  Blood pressure 119/70, pulse 70, weight 144 lb (65.318 kg).  Physical Exam  General: The patient is alert and cooperative at the time of the examination.  Skin: No significant peripheral edema is noted.   Neurologic Exam  Mental status: The Mini-Mental status examination done today shows a total score 22/30.  Cranial nerves: Facial symmetry is present. Speech is normal, no aphasia or dysarthria is noted. Extraocular movements are full. Visual fields are full.  Motor: The patient has good strength in all 4 extremities.  Sensory examination: Soft touch sensation is symmetric on the face, arms, and legs.  Coordination: The patient has good finger-nose-finger and heel-to-shin bilaterally. The patient does have some apraxia with the use of the extremities.  Gait and station: The patient has a normal gait. Tandem gait is normal. Romberg is negative. No drift is seen.  Reflexes: Deep tendon reflexes are symmetric.   Assessment/Plan:  1. Progressive dementia  The patient will be given Ambien to sleep at night. The patient will followup in 6 months. She will continue her Aricept and Namenda.  Marlan Palau MD 04/27/2013 6:51 PM  [facility] [address] [place], [place] [address]  Phone [phone] Fax [phone]

## 2013-11-01 ENCOUNTER — Ambulatory Visit: Payer: Medicare Other | Admitting: Neurology

## 2013-11-02 ENCOUNTER — Ambulatory Visit: Payer: Medicare Other | Admitting: Neurology

## 2013-11-15 ENCOUNTER — Telehealth: Payer: Self-pay | Admitting: Neurology

## 2013-11-15 MED ORDER — DONEPEZIL HCL 23 MG PO TABS: 23.0000 mg | ORAL_TABLET | Freq: Every day | ORAL | Status: DC

## 2013-11-15 MED ORDER — MEMANTINE HCL 10 MG PO TABS: 10.0000 mg | ORAL_TABLET | Freq: Two times a day (BID) | ORAL | Status: DC

## 2013-11-15 NOTE — Telephone Encounter (Signed)
Patient's daughter Glendon AxeLuanne, requesting 30 day supply of  memantine (NAMENDA) 10 MG tablet and donepezil (ARICEPT) 23 MG TABS tablet until [facility] send medication.  [facility] [address]  Please call anytime and may leave detailed message on voicemail.

## 2013-11-15 NOTE — Telephone Encounter (Signed)
Rx's have been sent.  I called back.  They are aware.

## 2013-11-16 ENCOUNTER — Other Ambulatory Visit: Payer: Self-pay

## 2013-11-16 MED ORDER — MEMANTINE HCL 10 MG PO TABS: 10.0000 mg | ORAL_TABLET | Freq: Two times a day (BID) | ORAL | Status: DC

## 2013-11-16 MED ORDER — DONEPEZIL HCL 23 MG PO TABS: 23.0000 mg | ORAL_TABLET | Freq: Every day | ORAL | Status: DC

## 2013-11-16 MED ORDER — SERTRALINE HCL 100 MG PO TABS: 100.0000 mg | ORAL_TABLET | Freq: Every day | ORAL | Status: DC

## 2014-01-03 ENCOUNTER — Encounter: Payer: Self-pay | Admitting: Neurology

## 2014-01-09 ENCOUNTER — Encounter: Payer: Self-pay | Admitting: Neurology

## 2014-03-05 ENCOUNTER — Telehealth: Payer: Self-pay

## 2014-03-05 NOTE — Telephone Encounter (Signed)
Patient's care taker called [facility] Fiscal(Lori) Called and they forgot about apt 03-06-14 with Dr.Willis. We need to reschedule her apt. There is know way we can make apt. Please call use back with first apt with Dr.Willis. Call back [phone].  Per request wanted to go on CX list. Patient care taker is aware of fee.

## 2014-03-06 ENCOUNTER — Ambulatory Visit: Payer: Medicare Other | Admitting: Neurology

## 2014-03-08 NOTE — Telephone Encounter (Signed)
Spoke to caregiver Lawson FiscalLori and patient is rescheduled for 03-19-14 at 12 noon.

## 2014-03-19 ENCOUNTER — Encounter: Payer: Self-pay | Admitting: Neurology

## 2014-03-19 ENCOUNTER — Ambulatory Visit (INDEPENDENT_AMBULATORY_CARE_PROVIDER_SITE_OTHER): Payer: BLUE CROSS/BLUE SHIELD | Admitting: Neurology

## 2014-03-19 VITALS — BP 131/87 | HR 93 | Ht 63.0 in | Wt 137.0 lb

## 2014-03-19 DIAGNOSIS — R413 Other amnesia: Secondary | ICD-10-CM

## 2014-03-19 MED ORDER — DONEPEZIL HCL 23 MG PO TABS: 23.0000 mg | ORAL_TABLET | Freq: Every day | ORAL | Status: DC

## 2014-03-19 MED ORDER — MIRTAZAPINE 30 MG PO TABS: 30.0000 mg | ORAL_TABLET | Freq: Every day | ORAL | Status: DC

## 2014-03-19 MED ORDER — MEMANTINE HCL 10 MG PO TABS: 10.0000 mg | ORAL_TABLET | Freq: Two times a day (BID) | ORAL | Status: DC

## 2014-03-19 NOTE — Progress Notes (Signed)
Reason for visit: Dementia  Michelle Savage is an 81 y.o. female  History of present illness:  Michelle Savage is an 81 year old right-handed white female with a history of a progressive dementing illness. She has had a significant issue with her hearing, and she is getting hearing aid for this. She lives at home, but she has a and assistance between 9 AM and 9 PM. In the evening hours, there are video cameras that her family monitors inside her home. She usually does not go to bed until about 2 AM, and she has had one episode where she went outside the house to look for her dog who has been dead for 2 or 3 years. The family is considering a transition to an extended care facility. They are concerned that this may worsen the agitation. The patient does not sleep well at night, she wanders about the house and goes into drawers. She is on Aricept and Namenda. She is on Zoloft 100 mg daily. A trial on Ambien resulted in insomnia. She returns to this office for an evaluation.  Past Medical History  Diagnosis Date  . Alzheimer disease   . Depression   . Hypertension   . Anemia   . Aortic insufficiency   . Mitral valve regurgitation   . Vitamin D deficiency   . Memory loss   . HOH (hard of hearing)     Past Surgical History  Procedure Laterality Date  . Cholecystectomy    . Tonsillectomy      Family History  Problem Relation Age of Onset  . Lung cancer Mother   . Dementia Sister   . Stroke Sister     Social history:  reports that she has never smoked. She has never used smokeless tobacco. She reports that she does not drink alcohol or use illicit drugs.    Allergies  Allergen Reactions  . Penicillins Rash    Medications:  Current Outpatient Prescriptions on File Prior to Visit  Medication Sig Dispense Refill  . aspirin 81 MG tablet Take 81 mg by mouth daily.     No current facility-administered medications on file prior to visit.    ROS:  Out of a complete 14 system  review of symptoms, the patient complains only of the following symptoms, and all other reviewed systems are negative.  Memory loss Agitation  Blood pressure 131/87, pulse 93, height  (1.6 m), weight 137 lb (62.143 kg).  Physical Exam  General: The patient is alert and cooperative at the time of the examination.  Skin: No significant peripheral edema is noted.   Neurologic Exam  Mental status: The Mini-Mental Status Examination done today shows a total score 23/30.  Cranial nerves: Facial symmetry is present. Speech is normal, no aphasia or dysarthria is noted. Extraocular movements are full. Visual fields are full.  Motor: The patient has good strength in all 4 extremities.  Sensory examination: Soft touch sensation is symmetric on the face, arms, and legs. No significant apraxia with the use of the extremities is noted.  Coordination: The patient has good finger-nose-finger and heel-to-shin bilaterally.  Gait and station: The patient has a normal gait. Romberg is negative. No drift is seen.  Reflexes: Deep tendon reflexes are symmetric.   Assessment/Plan:  1. Progressive dementia  The patient scores relatively well on the Mini-Mental Status Examination, but she is not functioning well. She requires some assistance with bathing, and dressing. The patient has had memory issues for greater than 10  years. She does not operate a motor vehicle. She is on maximum doses of Namenda and Aricept. We will switch the Zoloft for Remeron to take at nighttime to help her sleep. The family will contact me if she needs a higher dose. The patient may go to an extended care facility in the near future. She will follow-up in about 6 months.  Michelle Savage. Michelle Peyson Postema MD 03/19/2014 7:11 PM  [facility] [address] [place], [place] [id number]  Phone [phone] Fax [phone]

## 2014-03-19 NOTE — Patient Instructions (Signed)

## 2014-04-04 ENCOUNTER — Telehealth: Payer: Self-pay | Admitting: Neurology

## 2014-04-04 NOTE — Telephone Encounter (Signed)
Patient's daughter, Carlisle BeersLuann stated new Rx mirtazapine (REMERON) 30 MG tablet had patient very groggy for at least two days after taking.  Daughter has discontinued medication and questioning if she should decrease dose?  Also she forgot to give Rx SERTRALINE for the last 4 to 5 days and patient has become very mean.  She's resuming medication today, unless other wise instructed.  Please call and advise.

## 2014-04-04 NOTE — Telephone Encounter (Signed)
The patient is too drowsy on the 30 mg Remeron, we will give her half a tablet at night rather than a full tablet. She has been somewhat grumpy off of Zoloft, hopefully the Remeron will help.

## 2014-04-09 ENCOUNTER — Telehealth: Payer: Self-pay | Admitting: Neurology

## 2014-04-09 MED ORDER — MIRTAZAPINE 15 MG PO TABS: 7.5000 mg | ORAL_TABLET | Freq: Every day | ORAL | Status: DC

## 2014-04-09 NOTE — Telephone Encounter (Signed)
I called back.  Luanne said the patient is still hallucinating on Remeron 15mg .  Says she does not wake up until around 11am and yesterday dreamt that she had went to heaven and did not want to come back because it was so nice there.  Daughter would like to know if Rx can be changed to 15mg , taking one half tablet at night to see if this is more beneficial.  [facility], but they prefer Dr Anne HahnWillis address this upon his return.  They are aware he is not available today.

## 2014-04-09 NOTE — Telephone Encounter (Signed)
Patient's daughter, Carlisle BeersLuann stated they have tried 1/2 of dosage of Rx mirtazapine (REMERON) 30 MG tablet, patient still very groggy and experiencing hallucinations.  Please prescribe 15 mg and forward to [facility] [address]  Please call and advise.

## 2014-04-09 NOTE — Telephone Encounter (Signed)
I called patient. The dose of the Remeron will be cut back to 7.5 mg at night. The patient cannot tolerate this, we will have to stop the medication. I called in 10 of the 15 mg tablets. They are to take one half tablet at night.

## 2014-06-11 ENCOUNTER — Encounter: Payer: Self-pay | Admitting: Neurology

## 2014-06-11 ENCOUNTER — Telehealth: Payer: Self-pay | Admitting: Neurology

## 2014-06-11 NOTE — Telephone Encounter (Signed)
Carlisle BeersLuann, pt's daughter called/returning Dr. Anne HahnWillis' call. I relayed the message from below and patient was very appreciative and will pick up the letter tomorrow afternoon.

## 2014-06-11 NOTE — Telephone Encounter (Signed)
Patient's daughter Carlisle BeersLuann, stated [facility] [place] be [place] [facility] [place] and requesting a letter stating patient needs 24 hour care.  Daughter has someone coming into home from 9 am - 9 pm, and have cameras through out home to monitor patient when she's alone.  Unfortunately patient is getting to the point where she's opening the front door during the night.  Please call  @ [phone] and may leave detailed message when letter is ready for pick up.

## 2014-06-11 NOTE — Telephone Encounter (Signed)
I will write a letter. I have called the daughter to let her know that the letter is ready.

## 2014-09-21 ENCOUNTER — Ambulatory Visit: Payer: BLUE CROSS/BLUE SHIELD | Admitting: Neurology

## 2014-09-24 ENCOUNTER — Telehealth: Payer: Self-pay

## 2014-09-24 ENCOUNTER — Telehealth: Payer: Self-pay | Admitting: Neurology

## 2014-09-24 NOTE — Telephone Encounter (Signed)
Patient's daughter called to r/s appt.  Dr Anne Hahn will not be in office on 8/12. The next available is into Feb 18 2015. Please call to schedule to get this patient in. She comes every 6 mths Her daughter can be reached at [phone].

## 2014-09-24 NOTE — Telephone Encounter (Signed)
I spoke to [place] (patient's daughter). Appointment rescheduled to 8/25.

## 2014-09-24 NOTE — Telephone Encounter (Signed)
I called and spoke to patient's daughter, Raynelle Fanning. The patient now lives in [place] and her home number has been turned off. I explained that Dr. Anne Hahn will not be in the office Friday and I need to reschedule the patient's appointment. Raynelle Fanning stated she would have her sister, who handles all of the appointments, call back later today to reschedule. Patient can be scheduled in any [facility] spot August 22-26 (Karin Golden is on vacation).

## 2014-09-24 NOTE — Telephone Encounter (Signed)
Please see other phone note from today's date.

## 2014-09-28 ENCOUNTER — Ambulatory Visit: Payer: BLUE CROSS/BLUE SHIELD | Admitting: Neurology

## 2014-10-02 ENCOUNTER — Encounter: Payer: Self-pay | Admitting: Neurology

## 2014-10-11 ENCOUNTER — Ambulatory Visit (INDEPENDENT_AMBULATORY_CARE_PROVIDER_SITE_OTHER): Payer: Medicare Other | Admitting: Adult Health

## 2014-10-11 ENCOUNTER — Ambulatory Visit: Payer: Self-pay | Admitting: Neurology

## 2014-10-11 ENCOUNTER — Encounter: Payer: Self-pay | Admitting: Adult Health

## 2014-10-11 VITALS — BP 121/65 | HR 67 | Ht 62.0 in | Wt 142.0 lb

## 2014-10-11 DIAGNOSIS — R413 Other amnesia: Secondary | ICD-10-CM

## 2014-10-11 NOTE — Progress Notes (Signed)
PATIENT: Michelle Savage DOB: 1924-06-05  REASON FOR VISIT: follow up- memory HISTORY FROM: patient  HISTORY OF PRESENT ILLNESS: [name] is an 81 year old female with a history of a progressive dementing illness. She returns today for an evaluation. The patient does have significant hearing loss. She did get hearing aids however they are not working appropriately today. Most of the patient's history is obtained from the family. Her daughter is present today at the visit. She is currently living at [facility] in the memory unit. Daughter states that she is doing well there. Denies any significant changes in her memory. She requires assistance with most ADLs. Denies any agitation or aggressiveness. She states that the patient is sleeping well. She does have some sundowning. Overall things have remained stable. She returns today for an evaluation.   HISTORY 03/19/14: [name] is an 81 year old right-handed white female with a history of a progressive dementing illness. She has had a significant issue with her hearing, and she is getting hearing aid for this. She lives at home, but she has a and assistance between 9 AM and 9 PM. In the evening hours, there are video cameras that her family monitors inside her home. She usually does not go to bed until about 2 AM, and she has had one episode where she went outside the house to look for her dog who has been dead for 2 or 3 years. The family is considering a transition to an extended care facility. They are concerned that this may worsen the agitation. The patient does not sleep well at night, she wanders about the house and goes into drawers. She is on Aricept and Namenda. She is on Zoloft 100 mg daily. A trial on Ambien resulted in insomnia. She returns to this office for an evaluation.  REVIEW OF SYSTEMS: Out of a complete 14 system review of symptoms, the patient complains only of the following symptoms, and all other reviewed systems are  negative.  See HPI  ALLERGIES: Allergies  Allergen Reactions  . Penicillins Rash    HOME MEDICATIONS: Outpatient Prescriptions Prior to Visit  Medication Sig Dispense Refill  . aspirin 81 MG tablet Take 81 mg by mouth daily.    Marland Kitchen donepezil (ARICEPT) 23 MG TABS tablet Take 1 tablet (23 mg total) by mouth at bedtime. 90 tablet 1  . memantine (NAMENDA) 10 MG tablet Take 1 tablet (10 mg total) by mouth 2 (two) times daily. 180 tablet 1  . mirtazapine (REMERON) 15 MG tablet Take 0.5 tablets (7.5 mg total) by mouth at bedtime. 10 tablet 0   No facility-administered medications prior to visit.    PAST MEDICAL HISTORY: Past Medical History  Diagnosis Date  . Alzheimer disease   . Depression   . Hypertension   . Anemia   . Aortic insufficiency   . Mitral valve regurgitation   . Vitamin D deficiency   . Memory loss   . HOH (hard of hearing)     PAST SURGICAL HISTORY: Past Surgical History  Procedure Laterality Date  . Cholecystectomy    . Tonsillectomy      FAMILY HISTORY: Family History  Problem Relation Age of Onset  . Lung cancer Mother   . Dementia Sister   . Stroke Sister     SOCIAL HISTORY: Social History   Social History  . Marital Status: Widowed    Spouse Name: N/A  . Number of Children: 2  . Years of Education: COLLEGE   Occupational  History  . RETIRED     HOMEMAKER   Social History Main Topics  . Smoking status: Never Smoker   . Smokeless tobacco: Never Used  . Alcohol Use: No  . Drug Use: No  . Sexual Activity: No   Other Topics Concern  . Not on file   Social History Narrative      PHYSICAL EXAM  Filed Vitals:   10/11/14 1348  BP: 121/65  Pulse: 67  Height: 5\' 2"  (1.575 m)  Weight: 142 lb (64.411 kg)   Body mass index is 25.97 kg/(m^2).  Generalized: Well developed, in no acute distress   Neurological examination  Mentation: Alert. Follows all commands speech and language fluent. Unable to complete MMSE due to signigicant  hearing impairment. Cranial nerve II-XII:  Extraocular movements were full, visual field were full on confrontational test.  Uvula tongue midline. Head turning and shoulder shrug  were normal and symmetric. Motor: The motor testing reveals 5 over 5 strength of all 4 extremities. Good symmetric motor tone is noted throughout.  Sensory: unable to test Coordination: Cerebellar testing reveals good finger-nose-finger and heel-to-shin bilaterally.  Gait and station: Gait is normal. .  Reflexes: Deep tendon reflexes are symmetric and normal bilaterally.   DIAGNOSTIC DATA (LABS, IMAGING, TESTING) - I reviewed patient records, labs, notes, testing and imaging myself where available.  ASSESSMENT AND PLAN 81 y.o. year old female  has a past medical history of Alzheimer disease; Depression; Hypertension; Anemia; Aortic insufficiency; Mitral valve regurgitation; Vitamin D deficiency; Memory loss; and HOH (hard of hearing). here with:  1. Memory disorder  We were unable to test the patient's memory using the MMSE due to significant hearing impairment. The patient has an appointment tomorrow with the ENT doctor. Apparently her hearing aids are not working appropriately. According to the daughter the patient has remained stable. She will continue on Namenda and Aricept. I have advised the daughter that if her symptoms worsen or she develops new symptoms she should let us know. She will follow-up in 6 months or sooner if needed.   Butch Penny, MSN, NP-C 10/11/2014, 1:48 PM [facility] [address] [place], [place] [address] [phone]

## 2014-10-11 NOTE — Patient Instructions (Signed)
Continue Aricept and Namenda. If your symptoms worsen or you develop new symptoms please let us know.  

## 2014-10-20 NOTE — Progress Notes (Signed)
I agree with the assessment and plan. Zakkiyya Barno A. Jahron Hunsinger, MD, PhD Certified in Neurology, Clinical Neurophysiology, Sleep Medicine, Pain Medicine and Neuroimaging  [facility] [address], [address] [place], [place] [address] [phone]   

## 2014-11-06 ENCOUNTER — Encounter: Payer: Self-pay | Admitting: Neurology

## 2014-11-06 ENCOUNTER — Telehealth: Payer: Self-pay | Admitting: Neurology
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
## 2014-11-06 NOTE — Telephone Encounter (Signed)
I called the patient's daughter and left a voicemail.

## 2015-03-21 DIAGNOSIS — M6281 Muscle weakness (generalized): Secondary | ICD-10-CM | POA: Diagnosis not present

## 2015-03-21 DIAGNOSIS — R488 Other symbolic dysfunctions: Secondary | ICD-10-CM | POA: Diagnosis not present

## 2015-04-16 ENCOUNTER — Ambulatory Visit (INDEPENDENT_AMBULATORY_CARE_PROVIDER_SITE_OTHER): Payer: Medicare Other | Admitting: Neurology

## 2015-04-16 ENCOUNTER — Encounter: Payer: Self-pay | Admitting: Neurology

## 2015-04-16 VITALS — BP 120/80 | HR 66 | Resp 18 | Wt 147.5 lb

## 2015-04-16 DIAGNOSIS — F0390 Unspecified dementia without behavioral disturbance: Secondary | ICD-10-CM

## 2015-04-16 NOTE — Progress Notes (Signed)
Reason for visit: Alzheimer's disease  Michelle Savage is an 82 y.o. female  History of present illness:  Michelle Savage is an 82 year old right-handed white female with a history of a progressive memory disturbance consistent with Alzheimer's disease. The patient is otherwise in remarkably good health, she just turned 89. She now lives at [facility], and she loves the facility. She is hard of hearing, and she keeps losing her hearing aids. The patient is on Aricept and Namenda, she is tolerating the medications well. She has good appetite, and she sleeps well at night. At times, she may sundown at night or in the evenings. She does not have a significant issue with agitation. No other significant medical issues have come up since last seen.  Past Medical History  Diagnosis Date  . Alzheimer disease   . Depression   . Hypertension   . Anemia   . Aortic insufficiency   . Mitral valve regurgitation   . Vitamin D deficiency   . Memory loss   . HOH (hard of hearing)     Past Surgical History  Procedure Laterality Date  . Cholecystectomy    . Tonsillectomy      Family History  Problem Relation Age of Onset  . Lung cancer Mother   . Dementia Sister   . Stroke Sister     Social history:  reports that she has never smoked. She has never used smokeless tobacco. She reports that she does not drink alcohol or use illicit drugs.    Allergies  Allergen Reactions  . Penicillins Rash    Medications:  Prior to Admission medications   Medication Sig Start Date End Date Taking? Authorizing Provider  aspirin 81 MG tablet Take 81 mg by mouth daily.   Yes Historical Provider, MD  donepezil (ARICEPT) 23 MG TABS tablet Take 1 tablet (23 mg total) by mouth at bedtime. 03/19/14  Yes York Spaniel, MD  memantine (NAMENDA) 10 MG tablet Take 1 tablet (10 mg total) by mouth 2 (two) times daily. 03/19/14  Yes York Spaniel, MD  sertraline (ZOLOFT) 50 MG tablet  04/10/15   Historical  Provider, MD    ROS:  Out of a complete 14 system review of symptoms, the patient complains only of the following symptoms, and all other reviewed systems are negative.  Hearing loss Memory loss Confusion, anxiety  Blood pressure 120/80, pulse 66, resp. rate 18, weight 147 lb 8 oz (66.906 kg).  Physical Exam  General: The patient is alert and cooperative at the time of the examination.  Skin: No significant peripheral edema is noted.   Neurologic Exam  Mental status: The patient is alert and oriented x 2 at the time of the examination (not oriented to date). The Mini-Mental Status Examination done today shows a total score of 17/30.   Cranial nerves: Facial symmetry is present. Speech is normal, no aphasia or dysarthria is noted. Extraocular movements are full. Visual fields are full.  Motor: The patient has good strength in all 4 extremities.  Sensory examination: Soft touch sensation is symmetric on the face, arms, and legs.  Coordination: The patient has good finger-nose-finger and heel-to-shin bilaterally.  Gait and station: The patient has a normal gait. Tandem gait is normal. Romberg is negative. No drift is seen.  Reflexes: Deep tendon reflexes are symmetric.   Assessment/Plan:  1. Alzheimer's disease  The patient will continue on Namenda and Aricept, she will follow-up in 9 months, sooner if needed. She  seems to be doing well at this time.  Marlan Palau MD 04/16/2015 7:33 PM  [facility] [address] [place], [place] [address]  Phone [phone] Fax [phone]

## 2015-04-16 NOTE — Patient Instructions (Signed)

## 2015-11-13 DIAGNOSIS — L0390 Cellulitis, unspecified: Secondary | ICD-10-CM | POA: Diagnosis not present

## 2015-11-13 DIAGNOSIS — G309 Alzheimer's disease, unspecified: Secondary | ICD-10-CM | POA: Diagnosis not present

## 2015-12-18 DIAGNOSIS — Z23 Encounter for immunization: Secondary | ICD-10-CM | POA: Diagnosis not present

## 2015-12-18 DIAGNOSIS — H9190 Unspecified hearing loss, unspecified ear: Secondary | ICD-10-CM | POA: Diagnosis not present

## 2015-12-18 DIAGNOSIS — Z0000 Encounter for general adult medical examination without abnormal findings: Secondary | ICD-10-CM | POA: Diagnosis not present

## 2015-12-18 DIAGNOSIS — Z79899 Other long term (current) drug therapy: Secondary | ICD-10-CM | POA: Diagnosis not present

## 2015-12-18 DIAGNOSIS — L8990 Pressure ulcer of unspecified site, unspecified stage: Secondary | ICD-10-CM | POA: Diagnosis not present

## 2015-12-18 DIAGNOSIS — G309 Alzheimer's disease, unspecified: Secondary | ICD-10-CM | POA: Diagnosis not present

## 2015-12-28 DIAGNOSIS — L89512 Pressure ulcer of right ankle, stage 2: Secondary | ICD-10-CM | POA: Diagnosis not present

## 2015-12-28 DIAGNOSIS — F0280 Dementia in other diseases classified elsewhere without behavioral disturbance: Secondary | ICD-10-CM | POA: Diagnosis not present

## 2015-12-28 DIAGNOSIS — I10 Essential (primary) hypertension: Secondary | ICD-10-CM | POA: Diagnosis not present

## 2015-12-28 DIAGNOSIS — G309 Alzheimer's disease, unspecified: Secondary | ICD-10-CM | POA: Diagnosis not present

## 2016-01-02 DIAGNOSIS — Z23 Encounter for immunization: Secondary | ICD-10-CM | POA: Diagnosis not present

## 2016-01-14 ENCOUNTER — Ambulatory Visit: Payer: Medicare Other | Admitting: Neurology

## 2016-03-18 DIAGNOSIS — R488 Other symbolic dysfunctions: Secondary | ICD-10-CM | POA: Diagnosis not present

## 2016-03-19 ENCOUNTER — Encounter: Payer: Self-pay | Admitting: Neurology

## 2016-03-19 ENCOUNTER — Ambulatory Visit (INDEPENDENT_AMBULATORY_CARE_PROVIDER_SITE_OTHER): Payer: Medicare Other | Admitting: Neurology

## 2016-03-19 VITALS — BP 160/80 | HR 71 | Ht 62.0 in | Wt 141.8 lb

## 2016-03-19 DIAGNOSIS — R413 Other amnesia: Secondary | ICD-10-CM | POA: Diagnosis not present

## 2016-03-19 NOTE — Progress Notes (Signed)
Reason for visit: Alzheimer's disease  Michelle Savage is an 83 y.o. female  History of present illness:  [name] is an 83 year old right-handed white female with a history of a progressive memory disorder. The patient is residing in an extended care facility, she is on Aricept and Namenda, she is tolerating these medications well. She continues to have slow progression of her memory, she is remaining quite active, she is mobile. She has not had any other new medical issues that have come up since last seen. She returns for reevaluation. She was last seen one year ago.  Past Medical History:  Diagnosis Date  . Alzheimer disease   . Anemia   . Aortic insufficiency   . Depression   . HOH (hard of hearing)   . Hypertension   . Memory loss   . Mitral valve regurgitation   . Vitamin D deficiency     Past Surgical History:  Procedure Laterality Date  . CHOLECYSTECTOMY    . TONSILLECTOMY      Family History  Problem Relation Age of Onset  . Lung cancer Mother   . Dementia Sister   . Stroke Sister     Social history:  reports that she has never smoked. She has never used smokeless tobacco. She reports that she does not drink alcohol or use drugs.    Allergies  Allergen Reactions  . Penicillins Rash    Medications:  Prior to Admission medications   Medication Sig Start Date End Date Taking? Authorizing Provider  donepezil (ARICEPT) 23 MG TABS tablet Take 1 tablet (23 mg total) by mouth at bedtime. 03/19/14  Yes York Spanielharles K Willis, MD  memantine (NAMENDA) 10 MG tablet Take 1 tablet (10 mg total) by mouth 2 (two) times daily. 03/19/14  Yes York Spanielharles K Willis, MD  sertraline (ZOLOFT) 100 MG tablet  03/11/16  Yes Historical Provider, MD  aspirin 81 MG tablet Take 81 mg by mouth daily.    Historical Provider, MD    ROS:  Out of a complete 14 system review of symptoms, the patient complains only of the following symptoms, and all other reviewed systems are  negative.  Hearing loss Memory disturbance, confusion Daytime sleepiness  Blood pressure (!) 160/80, pulse 71, height 5\' 2"  (1.575 m), weight 141 lb 12 oz (64.3 kg).  Physical Exam  General: The patient is alert and cooperative at the time of the examination.  Skin: No significant peripheral edema is noted.   Neurologic Exam  Mental status: The patient is alert and oriented x 2 at the time of the examination (not oriented to date).   Cranial nerves: Facial symmetry is present. Speech is normal, no aphasia or dysarthria is noted. Extraocular movements are full. Visual fields are full. The patient is hard of hearing.  Motor: The patient has good strength in all 4 extremities.  Sensory examination: Soft touch sensation is symmetric on the face, arms, and legs.  Coordination: The patient has good finger-nose-finger and heel-to-shin bilaterally.  Gait and station: The patient has a normal gait. Romberg is negative. No drift is seen.  Reflexes: Deep tendon reflexes are symmetric.   Assessment/Plan:  1. Progressive memory disorder, Alzheimer's disease  The patient is doing remarkably well physically, she continues to progress cognitively. The patient will continue her Aricept and Namenda. She will follow-up in one year, sooner if needed. No change in medication today.  Marlan Palau. Keith Willis MD 03/19/2016 12:55 PM  [facility] [address]  [place], [place] [address]  Phone [phone] Fax [phone]

## 2016-03-20 DIAGNOSIS — R488 Other symbolic dysfunctions: Secondary | ICD-10-CM | POA: Diagnosis not present

## 2016-03-23 DIAGNOSIS — R488 Other symbolic dysfunctions: Secondary | ICD-10-CM | POA: Diagnosis not present

## 2016-03-25 DIAGNOSIS — R488 Other symbolic dysfunctions: Secondary | ICD-10-CM | POA: Diagnosis not present

## 2016-03-27 DIAGNOSIS — R488 Other symbolic dysfunctions: Secondary | ICD-10-CM | POA: Diagnosis not present

## 2016-03-30 DIAGNOSIS — R488 Other symbolic dysfunctions: Secondary | ICD-10-CM | POA: Diagnosis not present

## 2016-03-31 DIAGNOSIS — R488 Other symbolic dysfunctions: Secondary | ICD-10-CM | POA: Diagnosis not present

## 2016-04-03 DIAGNOSIS — R488 Other symbolic dysfunctions: Secondary | ICD-10-CM | POA: Diagnosis not present

## 2016-04-06 DIAGNOSIS — R488 Other symbolic dysfunctions: Secondary | ICD-10-CM | POA: Diagnosis not present

## 2016-04-08 DIAGNOSIS — R488 Other symbolic dysfunctions: Secondary | ICD-10-CM | POA: Diagnosis not present

## 2016-04-09 DIAGNOSIS — R488 Other symbolic dysfunctions: Secondary | ICD-10-CM | POA: Diagnosis not present

## 2016-04-13 DIAGNOSIS — R488 Other symbolic dysfunctions: Secondary | ICD-10-CM | POA: Diagnosis not present

## 2016-04-16 DIAGNOSIS — R488 Other symbolic dysfunctions: Secondary | ICD-10-CM | POA: Diagnosis not present

## 2016-04-16 DIAGNOSIS — R41841 Cognitive communication deficit: Secondary | ICD-10-CM | POA: Diagnosis not present

## 2016-04-17 ENCOUNTER — Emergency Department (HOSPITAL_COMMUNITY)
Admission: EM | Admit: 2016-04-17 | Discharge: 2016-04-17 | Disposition: A | Discharge status: Home / Self Care | Payer: Medicare Other | Attending: Emergency Medicine | Admitting: Emergency Medicine

## 2016-04-17 ENCOUNTER — Encounter (HOSPITAL_COMMUNITY): Payer: Self-pay | Admitting: Emergency Medicine

## 2016-04-17 ENCOUNTER — Emergency Department (HOSPITAL_COMMUNITY): Payer: Medicare Other

## 2016-04-17 DIAGNOSIS — S0993XA Unspecified injury of face, initial encounter: Secondary | ICD-10-CM | POA: Insufficient documentation

## 2016-04-17 DIAGNOSIS — I10 Essential (primary) hypertension: Secondary | ICD-10-CM | POA: Insufficient documentation

## 2016-04-17 DIAGNOSIS — Z7982 Long term (current) use of aspirin: Secondary | ICD-10-CM | POA: Diagnosis not present

## 2016-04-17 DIAGNOSIS — Z79899 Other long term (current) drug therapy: Secondary | ICD-10-CM | POA: Insufficient documentation

## 2016-04-17 DIAGNOSIS — Y939 Activity, unspecified: Secondary | ICD-10-CM | POA: Diagnosis not present

## 2016-04-17 DIAGNOSIS — G309 Alzheimer's disease, unspecified: Secondary | ICD-10-CM | POA: Diagnosis not present

## 2016-04-17 DIAGNOSIS — R51 Headache: Secondary | ICD-10-CM | POA: Diagnosis not present

## 2016-04-17 DIAGNOSIS — Y999 Unspecified external cause status: Secondary | ICD-10-CM | POA: Insufficient documentation

## 2016-04-17 DIAGNOSIS — R930 Abnormal findings on diagnostic imaging of skull and head, not elsewhere classified: Secondary | ICD-10-CM | POA: Diagnosis not present

## 2016-04-17 DIAGNOSIS — W19XXXA Unspecified fall, initial encounter: Secondary | ICD-10-CM | POA: Diagnosis not present

## 2016-04-17 DIAGNOSIS — Y929 Unspecified place or not applicable: Secondary | ICD-10-CM | POA: Diagnosis not present

## 2016-04-17 DIAGNOSIS — S0990XA Unspecified injury of head, initial encounter: Secondary | ICD-10-CM | POA: Diagnosis not present

## 2016-04-17 DIAGNOSIS — T148XXA Other injury of unspecified body region, initial encounter: Secondary | ICD-10-CM | POA: Diagnosis not present

## 2016-04-17 DIAGNOSIS — Z8673 Personal history of transient ischemic attack (TIA), and cerebral infarction without residual deficits: Secondary | ICD-10-CM | POA: Insufficient documentation

## 2016-04-17 NOTE — ED Notes (Signed)
Bed: [id number] Expected date:  Expected time:  Means of arrival:  Comments: 83yo F Fall

## 2016-04-17 NOTE — ED Triage Notes (Signed)
Per EMS, [facility] unwitnessed fall. Pt. Was found on the floor  In her room at around 0430 this morning, pt. Has injury on her lower lip , no active bleeding noted. Pt. Has dementia and can't remember what happened to her. Alert and oriented x1. Not on blood thinner.

## 2016-04-17 NOTE — ED Notes (Signed)
[facility] called for transport.  

## 2016-04-17 NOTE — ED Provider Notes (Signed)
[facility]-EMERGENCY DEPT Provider Note   CSN: [id number] Arrival date & time: 04/17/16  0501     History   Chief Complaint Chief Complaint  Patient presents with  . Fall  . Facial Injury    HPI Michelle Savage is a 83 y.o. female.  The history is provided by the patient. No language interpreter was used.  Fall  This is a new problem. The current episode started less than 1 hour ago. The problem occurs constantly. The problem has not changed since onset.Pertinent negatives include no chest pain, no abdominal pain and no shortness of breath. Nothing aggravates the symptoms. Nothing relieves the symptoms. She has tried nothing for the symptoms. The treatment provided no relief.  Facial Injury    Past Medical History:  Diagnosis Date  . Alzheimer disease   . Anemia   . Aortic insufficiency   . Depression   . HOH (hard of hearing)   . Hypertension   . Memory loss   . Mitral valve regurgitation   . Vitamin D deficiency     Patient Active Problem List   Diagnosis Date Noted  . Memory deficit 10/08/2012  . TIA (transient ischemic attack) 12/30/2010  . Dementia 12/30/2010  . Hyperglycemia 12/30/2010  . HYPERTENSION 01/15/2009    Past Surgical History:  Procedure Laterality Date  . CHOLECYSTECTOMY    . TONSILLECTOMY      OB History    No data available       Home Medications    Prior to Admission medications   Medication Sig Start Date End Date Taking? Authorizing Provider  aspirin 81 MG tablet Take 81 mg by mouth daily.    Historical Provider, MD  donepezil (ARICEPT) 23 MG TABS tablet Take 1 tablet (23 mg total) by mouth at bedtime. 03/19/14   York Spaniel, MD  memantine (NAMENDA) 10 MG tablet Take 1 tablet (10 mg total) by mouth 2 (two) times daily. 03/19/14   York Spaniel, MD  sertraline (ZOLOFT) 100 MG tablet  03/11/16   Historical Provider, MD    Family History Family History  Problem Relation Age of Onset  . Lung cancer Mother   . Dementia Sister    . Stroke Sister     Social History Social History  Substance Use Topics  . Smoking status: Never Smoker  . Smokeless tobacco: Never Used  . Alcohol use No     Allergies   Penicillins   Review of Systems Review of Systems  Respiratory: Negative for shortness of breath.   Cardiovascular: Negative for chest pain.  Gastrointestinal: Negative for abdominal pain.  All other systems reviewed and are negative.    Physical Exam Updated Vital Signs BP 94/58 (BP Location: Left Arm)   Pulse 74   Temp 97.7 F (36.5 C) (Oral)   Resp 18   SpO2 96% Comment: on RA  Physical Exam  Constitutional: She is oriented to person, place, and time. She appears well-developed and well-nourished.  HENT:  Head: Normocephalic and atraumatic. Head is without raccoon's eyes and without Battle's sign.  Mouth/Throat: No oropharyngeal exudate.  Eyes: Conjunctivae and EOM are normal. Pupils are equal, round, and reactive to light.  Neck: Normal range of motion. Neck supple.  Cardiovascular: Normal rate, regular rhythm and intact distal pulses.   Pulmonary/Chest: Effort normal and breath sounds normal. She has no wheezes. She has no rales.  Abdominal: Soft. Bowel sounds are normal. She exhibits no mass. There is no tenderness. There is no rebound  and no guarding.  Musculoskeletal: Normal range of motion.  Neurological: She is alert and oriented to person, place, and time. She displays normal reflexes. No cranial nerve deficit. Coordination normal.  Skin: Skin is warm and dry. Capillary refill takes less than 2 seconds.  Psychiatric: She has a normal mood and affect.     ED Treatments / Results   Vitals:   04/17/16 0502  BP: 94/58  Pulse: 74  Resp: 18  Temp: 97.7 F (36.5 C)    Results for orders placed or performed during the hospital encounter of 12/30/10  Protime-INR  Result Value Ref Range   Prothrombin Time 13.8 11.6 - 15.2 seconds   INR 1.04 0.00 - 1.49  APTT  Result Value Ref  Range   aPTT 30 24 - 37 seconds  CBC  Result Value Ref Range   WBC 10.9 (H) 4.0 - 10.5 K/uL   RBC 4.92 3.87 - 5.11 MIL/uL   Hemoglobin 15.7 (H) 12.0 - 15.0 g/dL   HCT 14.7 (H) 82.9 - 56.2 %   MCV 94.1 78.0 - 100.0 fL   MCH 31.9 26.0 - 34.0 pg   MCHC 33.9 30.0 - 36.0 g/dL   RDW 13.0 86.5 - 78.4 %   Platelets 129 (L) 150 - 400 K/uL  Differential  Result Value Ref Range   Neutrophils Relative % 96 (H) 43 - 77 %   Neutro Abs 10.4 (H) 1.7 - 7.7 K/uL   Lymphocytes Relative 1 (L) 12 - 46 %   Lymphs Abs 0.1 (L) 0.7 - 4.0 K/uL   Monocytes Relative 3 3 - 12 %   Monocytes Absolute 0.4 0.1 - 1.0 K/uL   Eosinophils Relative 0 0 - 5 %   Eosinophils Absolute 0.0 0.0 - 0.7 K/uL   Basophils Relative 0 0 - 1 %   Basophils Absolute 0.0 0.0 - 0.1 K/uL  CK total and CKMB  Result Value Ref Range   Total CK 105 7 - 177 U/L   CK, MB 2.6 0.3 - 4.0 ng/mL   Relative Index 2.5 0.0 - 2.5  Troponin I  Result Value Ref Range   Troponin I <0.30 <0.30 ng/mL  Basic metabolic panel  Result Value Ref Range   Sodium 141 135 - 145 mEq/L   Potassium 3.7 3.5 - 5.1 mEq/L   Chloride 102 96 - 112 mEq/L   CO2 26 19 - 32 mEq/L   Glucose, Bld 200 (H) 70 - 99 mg/dL   BUN 35 (H) 6 - 23 mg/dL   Creatinine, Ser 6.96 0.50 - 1.10 mg/dL   Calcium 9.7 8.4 - 29.5 mg/dL   GFR calc non Af Amer 59 (L) >90 mL/min   GFR calc Af Amer 68 (L) >90 mL/min  Urinalysis with microscopic  Result Value Ref Range   Color, Urine YELLOW YELLOW   APPearance CLOUDY (A) CLEAR   Specific Gravity, Urine 1.018 1.005 - 1.030   pH 5.5 5.0 - 8.0   Glucose, UA NEGATIVE NEGATIVE mg/dL   Hgb urine dipstick NEGATIVE NEGATIVE   Bilirubin Urine NEGATIVE NEGATIVE   Ketones, ur NEGATIVE NEGATIVE mg/dL   Protein, ur NEGATIVE NEGATIVE mg/dL   Urobilinogen, UA 1.0 0.0 - 1.0 mg/dL   Nitrite NEGATIVE NEGATIVE   Leukocytes, UA NEGATIVE NEGATIVE  Hemoglobin A1c  Result Value Ref Range   Hgb A1c MFr Bld 5.3 <5.7 %   Mean Plasma Glucose 105 <117 mg/dL    Fasting lipid panel  Result Value Ref Range  Cholesterol 138 0 - 200 mg/dL   Triglycerides 69 <102 mg/dL   HDL 57 >72 mg/dL   Total CHOL/HDL Ratio 2.4 RATIO   VLDL 14 0 - 40 mg/dL   LDL Cholesterol 67 0 - 99 mg/dL  CBC  Result Value Ref Range   WBC 6.2 4.0 - 10.5 K/uL   RBC 4.33 3.87 - 5.11 MIL/uL   Hemoglobin 13.3 12.0 - 15.0 g/dL   HCT 53.6 64.4 - 03.4 %   MCV 94.0 78.0 - 100.0 fL   MCH 30.7 26.0 - 34.0 pg   MCHC 32.7 30.0 - 36.0 g/dL   RDW 74.2 59.5 - 63.8 %   Platelets 119 (L) 150 - 400 K/uL  Basic metabolic panel  Result Value Ref Range   Sodium 137 135 - 145 mEq/L   Potassium 2.9 (L) 3.5 - 5.1 mEq/L   Chloride 102 96 - 112 mEq/L   CO2 26 19 - 32 mEq/L   Glucose, Bld 93 70 - 99 mg/dL   BUN 27 (H) 6 - 23 mg/dL   Creatinine, Ser 7.56 0.50 - 1.10 mg/dL   Calcium 8.2 (L) 8.4 - 10.5 mg/dL   GFR calc non Af Amer 76 (L) >90 mL/min   GFR calc Af Amer 88 (L) >90 mL/min  Glucose, capillary  Result Value Ref Range   Glucose-Capillary 112 (H) 70 - 99 mg/dL   Comment 1 Documented in Chart    Comment 2 Notify RN   Glucose, capillary  Result Value Ref Range   Glucose-Capillary 101 (H) 70 - 99 mg/dL  Basic metabolic panel  Result Value Ref Range   Sodium 138 135 - 145 mEq/L   Potassium 4.1 3.5 - 5.1 mEq/L   Chloride 106 96 - 112 mEq/L   CO2 25 19 - 32 mEq/L   Glucose, Bld 79 70 - 99 mg/dL   BUN 20 6 - 23 mg/dL   Creatinine, Ser 4.33 0.50 - 1.10 mg/dL   Calcium 8.7 8.4 - 29.5 mg/dL   GFR calc non Af Amer 75 (L) >90 mL/min   GFR calc Af Amer 87 (L) >90 mL/min  Magnesium  Result Value Ref Range   Magnesium 2.1 1.5 - 2.5 mg/dL  CBC  Result Value Ref Range   WBC 3.8 (L) 4.0 - 10.5 K/uL   RBC 4.45 3.87 - 5.11 MIL/uL   Hemoglobin 14.0 12.0 - 15.0 g/dL   HCT 18.8 41.6 - 60.6 %   MCV 93.9 78.0 - 100.0 fL   MCH 31.5 26.0 - 34.0 pg   MCHC 33.5 30.0 - 36.0 g/dL   RDW 30.1 60.1 - 09.3 %   Platelets 101 (L) 150 - 400 K/uL   Ct Head Wo Contrast  Result Date:  04/17/2016 CLINICAL DATA:  Fall.  Found on floor EXAM: CT HEAD WITHOUT CONTRAST CT MAXILLOFACIAL WITHOUT CONTRAST CT CERVICAL SPINE WITHOUT CONTRAST TECHNIQUE: Multidetector CT imaging of the head, cervical spine, and maxillofacial structures were performed using the standard protocol without intravenous contrast. Multiplanar CT image reconstructions of the cervical spine and maxillofacial structures were also generated. COMPARISON:  CT head 12/30/2010 FINDINGS: CT HEAD FINDINGS Brain: Moderate atrophy with progression since the prior study. Hypodensity in the cerebral white matter also with progression since prior study. Negative for acute infarct. Negative for intracranial hemorrhage or mass. No shift of the midline structures. Vascular: No hyperdense vessel or unexpected calcification. Skull: Negative Other: None CT MAXILLOFACIAL FINDINGS Osseous: Negative for facial fracture. Image quality degraded by motion.  Orbits: Negative for fracture.  No orbital soft tissue swelling. Sinuses: Negative Soft tissues: Soft tissue swelling of the upper lip. CT CERVICAL SPINE FINDINGS Alignment: Mild anterior slip C4-5. Skull base and vertebrae: Mild compression fracture superior endplates T2, T3, T4 of indeterminate age. No cervical spine fracture identified. Image quality degraded by mild motion. Soft tissues and spinal canal: 10 mm cystic nodule in the right thyroid with associated 9 mm calcification. Disc levels: Disc degeneration and spondylosis throughout the cervical spine, most prominent at C4-5, C5-6, and C6-7. These levels demonstrate spinal stenosis due to diffuse uncinate spurring. Scattered facet disease also present in the cervical spine. Upper chest: Lung apices clear. Other: None IMPRESSION: Atrophy and chronic microvascular ischemic change, with progression since 2012. No acute intracranial abnormality. Negative for facial fracture.  Soft tissue swelling upper lip Mild compression fractures superior endplates  T2, T3, and T4 of indeterminate age. No prior studies for comparison. Negative for cervical spine fracture. Moderate to advanced cervical spine spondylosis and spurring. Electronically Signed   By: Marlan Palauharles  Clark M.D.   On: 04/17/2016 06:48   Ct Cervical Spine Wo Contrast  Result Date: 04/17/2016 CLINICAL DATA:  Fall.  Found on floor EXAM: CT HEAD WITHOUT CONTRAST CT MAXILLOFACIAL WITHOUT CONTRAST CT CERVICAL SPINE WITHOUT CONTRAST TECHNIQUE: Multidetector CT imaging of the head, cervical spine, and maxillofacial structures were performed using the standard protocol without intravenous contrast. Multiplanar CT image reconstructions of the cervical spine and maxillofacial structures were also generated. COMPARISON:  CT head 12/30/2010 FINDINGS: CT HEAD FINDINGS Brain: Moderate atrophy with progression since the prior study. Hypodensity in the cerebral white matter also with progression since prior study. Negative for acute infarct. Negative for intracranial hemorrhage or mass. No shift of the midline structures. Vascular: No hyperdense vessel or unexpected calcification. Skull: Negative Other: None CT MAXILLOFACIAL FINDINGS Osseous: Negative for facial fracture. Image quality degraded by motion. Orbits: Negative for fracture.  No orbital soft tissue swelling. Sinuses: Negative Soft tissues: Soft tissue swelling of the upper lip. CT CERVICAL SPINE FINDINGS Alignment: Mild anterior slip C4-5. Skull base and vertebrae: Mild compression fracture superior endplates T2, T3, T4 of indeterminate age. No cervical spine fracture identified. Image quality degraded by mild motion. Soft tissues and spinal canal: 10 mm cystic nodule in the right thyroid with associated 9 mm calcification. Disc levels: Disc degeneration and spondylosis throughout the cervical spine, most prominent at C4-5, C5-6, and C6-7. These levels demonstrate spinal stenosis due to diffuse uncinate spurring. Scattered facet disease also present in the  cervical spine. Upper chest: Lung apices clear. Other: None IMPRESSION: Atrophy and chronic microvascular ischemic change, with progression since 2012. No acute intracranial abnormality. Negative for facial fracture.  Soft tissue swelling upper lip Mild compression fractures superior endplates T2, T3, and T4 of indeterminate age. No prior studies for comparison. Negative for cervical spine fracture. Moderate to advanced cervical spine spondylosis and spurring. Electronically Signed   By: Marlan Palauharles  Clark M.D.   On: 04/17/2016 06:48   Ct Maxillofacial Wo Contrast  Result Date: 04/17/2016 CLINICAL DATA:  Fall.  Found on floor EXAM: CT HEAD WITHOUT CONTRAST CT MAXILLOFACIAL WITHOUT CONTRAST CT CERVICAL SPINE WITHOUT CONTRAST TECHNIQUE: Multidetector CT imaging of the head, cervical spine, and maxillofacial structures were performed using the standard protocol without intravenous contrast. Multiplanar CT image reconstructions of the cervical spine and maxillofacial structures were also generated. COMPARISON:  CT head 12/30/2010 FINDINGS: CT HEAD FINDINGS Brain: Moderate atrophy with progression since the prior study. Hypodensity in the  cerebral white matter also with progression since prior study. Negative for acute infarct. Negative for intracranial hemorrhage or mass. No shift of the midline structures. Vascular: No hyperdense vessel or unexpected calcification. Skull: Negative Other: None CT MAXILLOFACIAL FINDINGS Osseous: Negative for facial fracture. Image quality degraded by motion. Orbits: Negative for fracture.  No orbital soft tissue swelling. Sinuses: Negative Soft tissues: Soft tissue swelling of the upper lip. CT CERVICAL SPINE FINDINGS Alignment: Mild anterior slip C4-5. Skull base and vertebrae: Mild compression fracture superior endplates T2, T3, T4 of indeterminate age. No cervical spine fracture identified. Image quality degraded by mild motion. Soft tissues and spinal canal: 10 mm cystic nodule in the  right thyroid with associated 9 mm calcification. Disc levels: Disc degeneration and spondylosis throughout the cervical spine, most prominent at C4-5, C5-6, and C6-7. These levels demonstrate spinal stenosis due to diffuse uncinate spurring. Scattered facet disease also present in the cervical spine. Upper chest: Lung apices clear. Other: None IMPRESSION: Atrophy and chronic microvascular ischemic change, with progression since 2012. No acute intracranial abnormality. Negative for facial fracture.  Soft tissue swelling upper lip Mild compression fractures superior endplates T2, T3, and T4 of indeterminate age. No prior studies for comparison. Negative for cervical spine fracture. Moderate to advanced cervical spine spondylosis and spurring. Electronically Signed   By: Marlan Palau M.D.   On: 04/17/2016 06:48    Procedures Procedures (including critical care time)     Final Clinical Impressions(s) / ED Diagnoses  Fall: The patient is nontoxic-appearing on exam and vital signs are normal. After history, exam, and medical workup I feel the patient has been appropriately medically screened and is safe for discharge home. Pertinent diagnoses were discussed with the patient. Patient was given return precautions.  Strict return precautions for fever, weakness, changes in vision or speech or cognition, numbnessor any concerns.     Cy Blamer, MD 04/17/16 [phone]

## 2016-04-17 NOTE — ED Notes (Signed)
Patient transported to CT 

## 2016-04-20 DIAGNOSIS — R41841 Cognitive communication deficit: Secondary | ICD-10-CM | POA: Diagnosis not present

## 2016-04-20 DIAGNOSIS — R488 Other symbolic dysfunctions: Secondary | ICD-10-CM | POA: Diagnosis not present

## 2016-04-21 DIAGNOSIS — R488 Other symbolic dysfunctions: Secondary | ICD-10-CM | POA: Diagnosis not present

## 2016-04-21 DIAGNOSIS — R41841 Cognitive communication deficit: Secondary | ICD-10-CM | POA: Diagnosis not present

## 2016-04-22 DIAGNOSIS — R41841 Cognitive communication deficit: Secondary | ICD-10-CM | POA: Diagnosis not present

## 2016-04-22 DIAGNOSIS — R488 Other symbolic dysfunctions: Secondary | ICD-10-CM | POA: Diagnosis not present

## 2016-04-24 DIAGNOSIS — R488 Other symbolic dysfunctions: Secondary | ICD-10-CM | POA: Diagnosis not present

## 2016-04-24 DIAGNOSIS — R41841 Cognitive communication deficit: Secondary | ICD-10-CM | POA: Diagnosis not present

## 2016-04-27 DIAGNOSIS — R41841 Cognitive communication deficit: Secondary | ICD-10-CM | POA: Diagnosis not present

## 2016-04-27 DIAGNOSIS — R488 Other symbolic dysfunctions: Secondary | ICD-10-CM | POA: Diagnosis not present

## 2016-04-29 DIAGNOSIS — R41841 Cognitive communication deficit: Secondary | ICD-10-CM | POA: Diagnosis not present

## 2016-04-29 DIAGNOSIS — R488 Other symbolic dysfunctions: Secondary | ICD-10-CM | POA: Diagnosis not present

## 2016-05-01 DIAGNOSIS — R41841 Cognitive communication deficit: Secondary | ICD-10-CM | POA: Diagnosis not present

## 2016-05-01 DIAGNOSIS — R488 Other symbolic dysfunctions: Secondary | ICD-10-CM | POA: Diagnosis not present

## 2016-05-04 DIAGNOSIS — R41841 Cognitive communication deficit: Secondary | ICD-10-CM | POA: Diagnosis not present

## 2016-05-04 DIAGNOSIS — R488 Other symbolic dysfunctions: Secondary | ICD-10-CM | POA: Diagnosis not present

## 2016-05-07 DIAGNOSIS — R488 Other symbolic dysfunctions: Secondary | ICD-10-CM | POA: Diagnosis not present

## 2016-05-07 DIAGNOSIS — R41841 Cognitive communication deficit: Secondary | ICD-10-CM | POA: Diagnosis not present

## 2016-05-11 DIAGNOSIS — R488 Other symbolic dysfunctions: Secondary | ICD-10-CM | POA: Diagnosis not present

## 2016-05-11 DIAGNOSIS — R41841 Cognitive communication deficit: Secondary | ICD-10-CM | POA: Diagnosis not present

## 2016-05-12 DIAGNOSIS — R488 Other symbolic dysfunctions: Secondary | ICD-10-CM | POA: Diagnosis not present

## 2016-05-12 DIAGNOSIS — R41841 Cognitive communication deficit: Secondary | ICD-10-CM | POA: Diagnosis not present

## 2016-05-13 DIAGNOSIS — R41841 Cognitive communication deficit: Secondary | ICD-10-CM | POA: Diagnosis not present

## 2016-05-13 DIAGNOSIS — R488 Other symbolic dysfunctions: Secondary | ICD-10-CM | POA: Diagnosis not present

## 2016-05-14 DIAGNOSIS — R488 Other symbolic dysfunctions: Secondary | ICD-10-CM | POA: Diagnosis not present

## 2016-05-14 DIAGNOSIS — R41841 Cognitive communication deficit: Secondary | ICD-10-CM | POA: Diagnosis not present

## 2016-05-15 DIAGNOSIS — R488 Other symbolic dysfunctions: Secondary | ICD-10-CM | POA: Diagnosis not present

## 2016-05-15 DIAGNOSIS — R41841 Cognitive communication deficit: Secondary | ICD-10-CM | POA: Diagnosis not present

## 2016-05-19 DIAGNOSIS — R488 Other symbolic dysfunctions: Secondary | ICD-10-CM | POA: Diagnosis not present

## 2016-05-19 DIAGNOSIS — R41841 Cognitive communication deficit: Secondary | ICD-10-CM | POA: Diagnosis not present

## 2016-05-20 DIAGNOSIS — R41841 Cognitive communication deficit: Secondary | ICD-10-CM | POA: Diagnosis not present

## 2016-05-20 DIAGNOSIS — R488 Other symbolic dysfunctions: Secondary | ICD-10-CM | POA: Diagnosis not present

## 2016-05-22 ENCOUNTER — Telehealth: Payer: Self-pay | Admitting: Neurology

## 2016-05-22 NOTE — Telephone Encounter (Signed)
I called the daughter. The daughter does not want the patient to go on sedative medications in the evening. The patient is having urinary incontinence at night, she may get agitated when they try to clean her up. They have already contacted the primary care doctor who has refused to give her any sedative medications. The daughter also does not want her to be on any medication. They may need to check a urinalysis as the urinary incontinence just started a week and a half ago.

## 2016-05-22 NOTE — Telephone Encounter (Signed)
Patient's daughter calling stating [facility] may be calling Dr. Anne Hahn to get authorization for the patient to be prescribed an anti psychotic medication because of bed wetting and being agitated. Patient's daughter disagrees and does not want the patient to be put on medication.

## 2016-05-26 DIAGNOSIS — R41841 Cognitive communication deficit: Secondary | ICD-10-CM | POA: Diagnosis not present

## 2016-05-26 DIAGNOSIS — R488 Other symbolic dysfunctions: Secondary | ICD-10-CM | POA: Diagnosis not present

## 2016-05-28 DIAGNOSIS — R41841 Cognitive communication deficit: Secondary | ICD-10-CM | POA: Diagnosis not present

## 2016-05-28 DIAGNOSIS — R488 Other symbolic dysfunctions: Secondary | ICD-10-CM | POA: Diagnosis not present

## 2016-05-29 DIAGNOSIS — R41841 Cognitive communication deficit: Secondary | ICD-10-CM | POA: Diagnosis not present

## 2016-05-29 DIAGNOSIS — R488 Other symbolic dysfunctions: Secondary | ICD-10-CM | POA: Diagnosis not present

## 2016-06-01 DIAGNOSIS — R41841 Cognitive communication deficit: Secondary | ICD-10-CM | POA: Diagnosis not present

## 2016-06-01 DIAGNOSIS — R488 Other symbolic dysfunctions: Secondary | ICD-10-CM | POA: Diagnosis not present

## 2016-06-04 DIAGNOSIS — R488 Other symbolic dysfunctions: Secondary | ICD-10-CM | POA: Diagnosis not present

## 2016-06-04 DIAGNOSIS — R41841 Cognitive communication deficit: Secondary | ICD-10-CM | POA: Diagnosis not present

## 2016-06-05 DIAGNOSIS — R41841 Cognitive communication deficit: Secondary | ICD-10-CM | POA: Diagnosis not present

## 2016-06-05 DIAGNOSIS — R488 Other symbolic dysfunctions: Secondary | ICD-10-CM | POA: Diagnosis not present

## 2016-06-08 DIAGNOSIS — R41841 Cognitive communication deficit: Secondary | ICD-10-CM | POA: Diagnosis not present

## 2016-06-08 DIAGNOSIS — R488 Other symbolic dysfunctions: Secondary | ICD-10-CM | POA: Diagnosis not present

## 2016-06-10 DIAGNOSIS — R41841 Cognitive communication deficit: Secondary | ICD-10-CM | POA: Diagnosis not present

## 2016-06-10 DIAGNOSIS — R488 Other symbolic dysfunctions: Secondary | ICD-10-CM | POA: Diagnosis not present

## 2016-06-12 DIAGNOSIS — R488 Other symbolic dysfunctions: Secondary | ICD-10-CM | POA: Diagnosis not present

## 2016-06-12 DIAGNOSIS — R41841 Cognitive communication deficit: Secondary | ICD-10-CM | POA: Diagnosis not present

## 2016-06-15 DIAGNOSIS — R41841 Cognitive communication deficit: Secondary | ICD-10-CM | POA: Diagnosis not present

## 2016-06-15 DIAGNOSIS — R488 Other symbolic dysfunctions: Secondary | ICD-10-CM | POA: Diagnosis not present

## 2016-06-17 DIAGNOSIS — R41841 Cognitive communication deficit: Secondary | ICD-10-CM | POA: Diagnosis not present

## 2016-06-17 DIAGNOSIS — M6281 Muscle weakness (generalized): Secondary | ICD-10-CM | POA: Diagnosis not present

## 2016-06-17 DIAGNOSIS — N3946 Mixed incontinence: Secondary | ICD-10-CM | POA: Diagnosis not present

## 2016-06-17 DIAGNOSIS — R488 Other symbolic dysfunctions: Secondary | ICD-10-CM | POA: Diagnosis not present

## 2016-06-19 DIAGNOSIS — N3946 Mixed incontinence: Secondary | ICD-10-CM | POA: Diagnosis not present

## 2016-06-19 DIAGNOSIS — M6281 Muscle weakness (generalized): Secondary | ICD-10-CM | POA: Diagnosis not present

## 2016-06-19 DIAGNOSIS — R41841 Cognitive communication deficit: Secondary | ICD-10-CM | POA: Diagnosis not present

## 2016-06-19 DIAGNOSIS — R488 Other symbolic dysfunctions: Secondary | ICD-10-CM | POA: Diagnosis not present

## 2016-06-22 DIAGNOSIS — R41841 Cognitive communication deficit: Secondary | ICD-10-CM | POA: Diagnosis not present

## 2016-06-22 DIAGNOSIS — R488 Other symbolic dysfunctions: Secondary | ICD-10-CM | POA: Diagnosis not present

## 2016-06-22 DIAGNOSIS — N3946 Mixed incontinence: Secondary | ICD-10-CM | POA: Diagnosis not present

## 2016-06-22 DIAGNOSIS — M6281 Muscle weakness (generalized): Secondary | ICD-10-CM | POA: Diagnosis not present

## 2016-06-24 DIAGNOSIS — N3946 Mixed incontinence: Secondary | ICD-10-CM | POA: Diagnosis not present

## 2016-06-24 DIAGNOSIS — R41841 Cognitive communication deficit: Secondary | ICD-10-CM | POA: Diagnosis not present

## 2016-06-24 DIAGNOSIS — R488 Other symbolic dysfunctions: Secondary | ICD-10-CM | POA: Diagnosis not present

## 2016-06-24 DIAGNOSIS — M6281 Muscle weakness (generalized): Secondary | ICD-10-CM | POA: Diagnosis not present

## 2016-06-26 DIAGNOSIS — R41841 Cognitive communication deficit: Secondary | ICD-10-CM | POA: Diagnosis not present

## 2016-06-26 DIAGNOSIS — M6281 Muscle weakness (generalized): Secondary | ICD-10-CM | POA: Diagnosis not present

## 2016-06-26 DIAGNOSIS — R488 Other symbolic dysfunctions: Secondary | ICD-10-CM | POA: Diagnosis not present

## 2016-06-26 DIAGNOSIS — N3946 Mixed incontinence: Secondary | ICD-10-CM | POA: Diagnosis not present

## 2016-06-29 DIAGNOSIS — R41841 Cognitive communication deficit: Secondary | ICD-10-CM | POA: Diagnosis not present

## 2016-06-29 DIAGNOSIS — N3946 Mixed incontinence: Secondary | ICD-10-CM | POA: Diagnosis not present

## 2016-06-29 DIAGNOSIS — R488 Other symbolic dysfunctions: Secondary | ICD-10-CM | POA: Diagnosis not present

## 2016-06-29 DIAGNOSIS — M6281 Muscle weakness (generalized): Secondary | ICD-10-CM | POA: Diagnosis not present

## 2016-06-30 DIAGNOSIS — S82831A Other fracture of upper and lower end of right fibula, initial encounter for closed fracture: Secondary | ICD-10-CM | POA: Diagnosis not present

## 2016-07-01 DIAGNOSIS — N3946 Mixed incontinence: Secondary | ICD-10-CM | POA: Diagnosis not present

## 2016-07-01 DIAGNOSIS — R488 Other symbolic dysfunctions: Secondary | ICD-10-CM | POA: Diagnosis not present

## 2016-07-01 DIAGNOSIS — M6281 Muscle weakness (generalized): Secondary | ICD-10-CM | POA: Diagnosis not present

## 2016-07-01 DIAGNOSIS — R41841 Cognitive communication deficit: Secondary | ICD-10-CM | POA: Diagnosis not present

## 2016-07-02 DIAGNOSIS — M6281 Muscle weakness (generalized): Secondary | ICD-10-CM | POA: Diagnosis not present

## 2016-07-02 DIAGNOSIS — R488 Other symbolic dysfunctions: Secondary | ICD-10-CM | POA: Diagnosis not present

## 2016-07-02 DIAGNOSIS — R41841 Cognitive communication deficit: Secondary | ICD-10-CM | POA: Diagnosis not present

## 2016-07-02 DIAGNOSIS — N3946 Mixed incontinence: Secondary | ICD-10-CM | POA: Diagnosis not present

## 2016-07-03 DIAGNOSIS — S8264XA Nondisplaced fracture of lateral malleolus of right fibula, initial encounter for closed fracture: Secondary | ICD-10-CM | POA: Diagnosis not present

## 2016-07-06 DIAGNOSIS — N3946 Mixed incontinence: Secondary | ICD-10-CM | POA: Diagnosis not present

## 2016-07-06 DIAGNOSIS — M6281 Muscle weakness (generalized): Secondary | ICD-10-CM | POA: Diagnosis not present

## 2016-07-06 DIAGNOSIS — R488 Other symbolic dysfunctions: Secondary | ICD-10-CM | POA: Diagnosis not present

## 2016-07-06 DIAGNOSIS — R41841 Cognitive communication deficit: Secondary | ICD-10-CM | POA: Diagnosis not present

## 2016-07-07 DIAGNOSIS — R41841 Cognitive communication deficit: Secondary | ICD-10-CM | POA: Diagnosis not present

## 2016-07-07 DIAGNOSIS — N3946 Mixed incontinence: Secondary | ICD-10-CM | POA: Diagnosis not present

## 2016-07-07 DIAGNOSIS — M6281 Muscle weakness (generalized): Secondary | ICD-10-CM | POA: Diagnosis not present

## 2016-07-07 DIAGNOSIS — R488 Other symbolic dysfunctions: Secondary | ICD-10-CM | POA: Diagnosis not present

## 2016-07-08 DIAGNOSIS — N3946 Mixed incontinence: Secondary | ICD-10-CM | POA: Diagnosis not present

## 2016-07-08 DIAGNOSIS — M6281 Muscle weakness (generalized): Secondary | ICD-10-CM | POA: Diagnosis not present

## 2016-07-08 DIAGNOSIS — R488 Other symbolic dysfunctions: Secondary | ICD-10-CM | POA: Diagnosis not present

## 2016-07-08 DIAGNOSIS — R41841 Cognitive communication deficit: Secondary | ICD-10-CM | POA: Diagnosis not present

## 2016-07-09 DIAGNOSIS — N3946 Mixed incontinence: Secondary | ICD-10-CM | POA: Diagnosis not present

## 2016-07-09 DIAGNOSIS — R488 Other symbolic dysfunctions: Secondary | ICD-10-CM | POA: Diagnosis not present

## 2016-07-09 DIAGNOSIS — R41841 Cognitive communication deficit: Secondary | ICD-10-CM | POA: Diagnosis not present

## 2016-07-09 DIAGNOSIS — M6281 Muscle weakness (generalized): Secondary | ICD-10-CM | POA: Diagnosis not present

## 2016-07-15 DIAGNOSIS — N3946 Mixed incontinence: Secondary | ICD-10-CM | POA: Diagnosis not present

## 2016-07-15 DIAGNOSIS — R488 Other symbolic dysfunctions: Secondary | ICD-10-CM | POA: Diagnosis not present

## 2016-07-15 DIAGNOSIS — M6281 Muscle weakness (generalized): Secondary | ICD-10-CM | POA: Diagnosis not present

## 2016-07-15 DIAGNOSIS — R41841 Cognitive communication deficit: Secondary | ICD-10-CM | POA: Diagnosis not present

## 2016-07-16 DIAGNOSIS — M6281 Muscle weakness (generalized): Secondary | ICD-10-CM | POA: Diagnosis not present

## 2016-07-16 DIAGNOSIS — R488 Other symbolic dysfunctions: Secondary | ICD-10-CM | POA: Diagnosis not present

## 2016-07-16 DIAGNOSIS — R41841 Cognitive communication deficit: Secondary | ICD-10-CM | POA: Diagnosis not present

## 2016-07-16 DIAGNOSIS — N3946 Mixed incontinence: Secondary | ICD-10-CM | POA: Diagnosis not present

## 2016-07-17 DIAGNOSIS — R41841 Cognitive communication deficit: Secondary | ICD-10-CM | POA: Diagnosis not present

## 2016-07-17 DIAGNOSIS — R2681 Unsteadiness on feet: Secondary | ICD-10-CM | POA: Diagnosis not present

## 2016-07-17 DIAGNOSIS — N3946 Mixed incontinence: Secondary | ICD-10-CM | POA: Diagnosis not present

## 2016-07-17 DIAGNOSIS — M6281 Muscle weakness (generalized): Secondary | ICD-10-CM | POA: Diagnosis not present

## 2016-07-20 DIAGNOSIS — M6281 Muscle weakness (generalized): Secondary | ICD-10-CM | POA: Diagnosis not present

## 2016-07-20 DIAGNOSIS — R2681 Unsteadiness on feet: Secondary | ICD-10-CM | POA: Diagnosis not present

## 2016-07-20 DIAGNOSIS — R41841 Cognitive communication deficit: Secondary | ICD-10-CM | POA: Diagnosis not present

## 2016-07-20 DIAGNOSIS — N3946 Mixed incontinence: Secondary | ICD-10-CM | POA: Diagnosis not present

## 2016-07-21 DIAGNOSIS — R41841 Cognitive communication deficit: Secondary | ICD-10-CM | POA: Diagnosis not present

## 2016-07-21 DIAGNOSIS — N3946 Mixed incontinence: Secondary | ICD-10-CM | POA: Diagnosis not present

## 2016-07-21 DIAGNOSIS — R2681 Unsteadiness on feet: Secondary | ICD-10-CM | POA: Diagnosis not present

## 2016-07-21 DIAGNOSIS — M6281 Muscle weakness (generalized): Secondary | ICD-10-CM | POA: Diagnosis not present

## 2016-07-22 DIAGNOSIS — R41841 Cognitive communication deficit: Secondary | ICD-10-CM | POA: Diagnosis not present

## 2016-07-22 DIAGNOSIS — N3946 Mixed incontinence: Secondary | ICD-10-CM | POA: Diagnosis not present

## 2016-07-22 DIAGNOSIS — M6281 Muscle weakness (generalized): Secondary | ICD-10-CM | POA: Diagnosis not present

## 2016-07-22 DIAGNOSIS — R2681 Unsteadiness on feet: Secondary | ICD-10-CM | POA: Diagnosis not present

## 2016-07-24 DIAGNOSIS — M6281 Muscle weakness (generalized): Secondary | ICD-10-CM | POA: Diagnosis not present

## 2016-07-24 DIAGNOSIS — R2681 Unsteadiness on feet: Secondary | ICD-10-CM | POA: Diagnosis not present

## 2016-07-24 DIAGNOSIS — R41841 Cognitive communication deficit: Secondary | ICD-10-CM | POA: Diagnosis not present

## 2016-07-24 DIAGNOSIS — N3946 Mixed incontinence: Secondary | ICD-10-CM | POA: Diagnosis not present

## 2016-07-28 DIAGNOSIS — R2681 Unsteadiness on feet: Secondary | ICD-10-CM | POA: Diagnosis not present

## 2016-07-28 DIAGNOSIS — M6281 Muscle weakness (generalized): Secondary | ICD-10-CM | POA: Diagnosis not present

## 2016-07-28 DIAGNOSIS — R41841 Cognitive communication deficit: Secondary | ICD-10-CM | POA: Diagnosis not present

## 2016-07-28 DIAGNOSIS — N3946 Mixed incontinence: Secondary | ICD-10-CM | POA: Diagnosis not present

## 2016-07-30 DIAGNOSIS — R41841 Cognitive communication deficit: Secondary | ICD-10-CM | POA: Diagnosis not present

## 2016-07-30 DIAGNOSIS — N3946 Mixed incontinence: Secondary | ICD-10-CM | POA: Diagnosis not present

## 2016-07-30 DIAGNOSIS — M6281 Muscle weakness (generalized): Secondary | ICD-10-CM | POA: Diagnosis not present

## 2016-07-30 DIAGNOSIS — R2681 Unsteadiness on feet: Secondary | ICD-10-CM | POA: Diagnosis not present

## 2016-07-31 DIAGNOSIS — R2681 Unsteadiness on feet: Secondary | ICD-10-CM | POA: Diagnosis not present

## 2016-07-31 DIAGNOSIS — N3946 Mixed incontinence: Secondary | ICD-10-CM | POA: Diagnosis not present

## 2016-07-31 DIAGNOSIS — M6281 Muscle weakness (generalized): Secondary | ICD-10-CM | POA: Diagnosis not present

## 2016-07-31 DIAGNOSIS — R41841 Cognitive communication deficit: Secondary | ICD-10-CM | POA: Diagnosis not present

## 2016-08-03 DIAGNOSIS — R41841 Cognitive communication deficit: Secondary | ICD-10-CM | POA: Diagnosis not present

## 2016-08-03 DIAGNOSIS — N3946 Mixed incontinence: Secondary | ICD-10-CM | POA: Diagnosis not present

## 2016-08-03 DIAGNOSIS — R2681 Unsteadiness on feet: Secondary | ICD-10-CM | POA: Diagnosis not present

## 2016-08-03 DIAGNOSIS — S8264XD Nondisplaced fracture of lateral malleolus of right fibula, subsequent encounter for closed fracture with routine healing: Secondary | ICD-10-CM | POA: Diagnosis not present

## 2016-08-03 DIAGNOSIS — M6281 Muscle weakness (generalized): Secondary | ICD-10-CM | POA: Diagnosis not present

## 2016-08-04 DIAGNOSIS — M6281 Muscle weakness (generalized): Secondary | ICD-10-CM | POA: Diagnosis not present

## 2016-08-04 DIAGNOSIS — R41841 Cognitive communication deficit: Secondary | ICD-10-CM | POA: Diagnosis not present

## 2016-08-04 DIAGNOSIS — N3946 Mixed incontinence: Secondary | ICD-10-CM | POA: Diagnosis not present

## 2016-08-04 DIAGNOSIS — R2681 Unsteadiness on feet: Secondary | ICD-10-CM | POA: Diagnosis not present

## 2016-08-07 DIAGNOSIS — R41841 Cognitive communication deficit: Secondary | ICD-10-CM | POA: Diagnosis not present

## 2016-08-07 DIAGNOSIS — R2681 Unsteadiness on feet: Secondary | ICD-10-CM | POA: Diagnosis not present

## 2016-08-07 DIAGNOSIS — M6281 Muscle weakness (generalized): Secondary | ICD-10-CM | POA: Diagnosis not present

## 2016-08-07 DIAGNOSIS — N3946 Mixed incontinence: Secondary | ICD-10-CM | POA: Diagnosis not present

## 2016-08-10 DIAGNOSIS — M6281 Muscle weakness (generalized): Secondary | ICD-10-CM | POA: Diagnosis not present

## 2016-08-10 DIAGNOSIS — N3946 Mixed incontinence: Secondary | ICD-10-CM | POA: Diagnosis not present

## 2016-08-10 DIAGNOSIS — R2681 Unsteadiness on feet: Secondary | ICD-10-CM | POA: Diagnosis not present

## 2016-08-10 DIAGNOSIS — R41841 Cognitive communication deficit: Secondary | ICD-10-CM | POA: Diagnosis not present

## 2016-08-12 DIAGNOSIS — N3946 Mixed incontinence: Secondary | ICD-10-CM | POA: Diagnosis not present

## 2016-08-12 DIAGNOSIS — R2681 Unsteadiness on feet: Secondary | ICD-10-CM | POA: Diagnosis not present

## 2016-08-12 DIAGNOSIS — R41841 Cognitive communication deficit: Secondary | ICD-10-CM | POA: Diagnosis not present

## 2016-08-12 DIAGNOSIS — M6281 Muscle weakness (generalized): Secondary | ICD-10-CM | POA: Diagnosis not present

## 2016-08-13 DIAGNOSIS — R2681 Unsteadiness on feet: Secondary | ICD-10-CM | POA: Diagnosis not present

## 2016-08-13 DIAGNOSIS — M6281 Muscle weakness (generalized): Secondary | ICD-10-CM | POA: Diagnosis not present

## 2016-08-13 DIAGNOSIS — N3946 Mixed incontinence: Secondary | ICD-10-CM | POA: Diagnosis not present

## 2016-08-13 DIAGNOSIS — R41841 Cognitive communication deficit: Secondary | ICD-10-CM | POA: Diagnosis not present

## 2016-08-17 DIAGNOSIS — R488 Other symbolic dysfunctions: Secondary | ICD-10-CM | POA: Diagnosis not present

## 2016-08-17 DIAGNOSIS — N3946 Mixed incontinence: Secondary | ICD-10-CM | POA: Diagnosis not present

## 2016-08-17 DIAGNOSIS — M6281 Muscle weakness (generalized): Secondary | ICD-10-CM | POA: Diagnosis not present

## 2016-08-17 DIAGNOSIS — R2681 Unsteadiness on feet: Secondary | ICD-10-CM | POA: Diagnosis not present

## 2016-08-18 DIAGNOSIS — R2681 Unsteadiness on feet: Secondary | ICD-10-CM | POA: Diagnosis not present

## 2016-08-18 DIAGNOSIS — N3946 Mixed incontinence: Secondary | ICD-10-CM | POA: Diagnosis not present

## 2016-08-18 DIAGNOSIS — R488 Other symbolic dysfunctions: Secondary | ICD-10-CM | POA: Diagnosis not present

## 2016-08-18 DIAGNOSIS — M6281 Muscle weakness (generalized): Secondary | ICD-10-CM | POA: Diagnosis not present

## 2016-08-20 DIAGNOSIS — R2681 Unsteadiness on feet: Secondary | ICD-10-CM | POA: Diagnosis not present

## 2016-08-20 DIAGNOSIS — M6281 Muscle weakness (generalized): Secondary | ICD-10-CM | POA: Diagnosis not present

## 2016-08-20 DIAGNOSIS — R488 Other symbolic dysfunctions: Secondary | ICD-10-CM | POA: Diagnosis not present

## 2016-08-20 DIAGNOSIS — N3946 Mixed incontinence: Secondary | ICD-10-CM | POA: Diagnosis not present

## 2016-08-21 DIAGNOSIS — F0391 Unspecified dementia with behavioral disturbance: Secondary | ICD-10-CM | POA: Diagnosis not present

## 2016-08-21 DIAGNOSIS — R2681 Unsteadiness on feet: Secondary | ICD-10-CM | POA: Diagnosis not present

## 2016-08-21 DIAGNOSIS — N3946 Mixed incontinence: Secondary | ICD-10-CM | POA: Diagnosis not present

## 2016-08-21 DIAGNOSIS — R488 Other symbolic dysfunctions: Secondary | ICD-10-CM | POA: Diagnosis not present

## 2016-08-21 DIAGNOSIS — M6281 Muscle weakness (generalized): Secondary | ICD-10-CM | POA: Diagnosis not present

## 2016-08-24 DIAGNOSIS — N3946 Mixed incontinence: Secondary | ICD-10-CM | POA: Diagnosis not present

## 2016-08-24 DIAGNOSIS — M6281 Muscle weakness (generalized): Secondary | ICD-10-CM | POA: Diagnosis not present

## 2016-08-24 DIAGNOSIS — R488 Other symbolic dysfunctions: Secondary | ICD-10-CM | POA: Diagnosis not present

## 2016-08-24 DIAGNOSIS — R2681 Unsteadiness on feet: Secondary | ICD-10-CM | POA: Diagnosis not present

## 2016-08-25 DIAGNOSIS — R488 Other symbolic dysfunctions: Secondary | ICD-10-CM | POA: Diagnosis not present

## 2016-08-25 DIAGNOSIS — M6281 Muscle weakness (generalized): Secondary | ICD-10-CM | POA: Diagnosis not present

## 2016-08-25 DIAGNOSIS — R2681 Unsteadiness on feet: Secondary | ICD-10-CM | POA: Diagnosis not present

## 2016-08-25 DIAGNOSIS — N3946 Mixed incontinence: Secondary | ICD-10-CM | POA: Diagnosis not present

## 2016-08-26 DIAGNOSIS — R2681 Unsteadiness on feet: Secondary | ICD-10-CM | POA: Diagnosis not present

## 2016-08-26 DIAGNOSIS — M6281 Muscle weakness (generalized): Secondary | ICD-10-CM | POA: Diagnosis not present

## 2016-08-26 DIAGNOSIS — R488 Other symbolic dysfunctions: Secondary | ICD-10-CM | POA: Diagnosis not present

## 2016-08-26 DIAGNOSIS — N3946 Mixed incontinence: Secondary | ICD-10-CM | POA: Diagnosis not present

## 2016-08-27 DIAGNOSIS — R2681 Unsteadiness on feet: Secondary | ICD-10-CM | POA: Diagnosis not present

## 2016-08-27 DIAGNOSIS — N3946 Mixed incontinence: Secondary | ICD-10-CM | POA: Diagnosis not present

## 2016-08-27 DIAGNOSIS — M6281 Muscle weakness (generalized): Secondary | ICD-10-CM | POA: Diagnosis not present

## 2016-08-27 DIAGNOSIS — R488 Other symbolic dysfunctions: Secondary | ICD-10-CM | POA: Diagnosis not present

## 2016-08-28 DIAGNOSIS — R2681 Unsteadiness on feet: Secondary | ICD-10-CM | POA: Diagnosis not present

## 2016-08-28 DIAGNOSIS — R488 Other symbolic dysfunctions: Secondary | ICD-10-CM | POA: Diagnosis not present

## 2016-08-28 DIAGNOSIS — N3946 Mixed incontinence: Secondary | ICD-10-CM | POA: Diagnosis not present

## 2016-08-28 DIAGNOSIS — M6281 Muscle weakness (generalized): Secondary | ICD-10-CM | POA: Diagnosis not present

## 2016-08-31 DIAGNOSIS — M6281 Muscle weakness (generalized): Secondary | ICD-10-CM | POA: Diagnosis not present

## 2016-08-31 DIAGNOSIS — N3946 Mixed incontinence: Secondary | ICD-10-CM | POA: Diagnosis not present

## 2016-08-31 DIAGNOSIS — R488 Other symbolic dysfunctions: Secondary | ICD-10-CM | POA: Diagnosis not present

## 2016-08-31 DIAGNOSIS — R2681 Unsteadiness on feet: Secondary | ICD-10-CM | POA: Diagnosis not present

## 2016-09-01 DIAGNOSIS — N3946 Mixed incontinence: Secondary | ICD-10-CM | POA: Diagnosis not present

## 2016-09-01 DIAGNOSIS — R2681 Unsteadiness on feet: Secondary | ICD-10-CM | POA: Diagnosis not present

## 2016-09-01 DIAGNOSIS — R488 Other symbolic dysfunctions: Secondary | ICD-10-CM | POA: Diagnosis not present

## 2016-09-01 DIAGNOSIS — M6281 Muscle weakness (generalized): Secondary | ICD-10-CM | POA: Diagnosis not present

## 2016-09-02 DIAGNOSIS — R488 Other symbolic dysfunctions: Secondary | ICD-10-CM | POA: Diagnosis not present

## 2016-09-02 DIAGNOSIS — M6281 Muscle weakness (generalized): Secondary | ICD-10-CM | POA: Diagnosis not present

## 2016-09-02 DIAGNOSIS — N3946 Mixed incontinence: Secondary | ICD-10-CM | POA: Diagnosis not present

## 2016-09-02 DIAGNOSIS — R2681 Unsteadiness on feet: Secondary | ICD-10-CM | POA: Diagnosis not present

## 2016-09-03 DIAGNOSIS — M6281 Muscle weakness (generalized): Secondary | ICD-10-CM | POA: Diagnosis not present

## 2016-09-03 DIAGNOSIS — R2681 Unsteadiness on feet: Secondary | ICD-10-CM | POA: Diagnosis not present

## 2016-09-03 DIAGNOSIS — R488 Other symbolic dysfunctions: Secondary | ICD-10-CM | POA: Diagnosis not present

## 2016-09-03 DIAGNOSIS — N3946 Mixed incontinence: Secondary | ICD-10-CM | POA: Diagnosis not present

## 2016-09-07 DIAGNOSIS — R488 Other symbolic dysfunctions: Secondary | ICD-10-CM | POA: Diagnosis not present

## 2016-09-07 DIAGNOSIS — N3946 Mixed incontinence: Secondary | ICD-10-CM | POA: Diagnosis not present

## 2016-09-07 DIAGNOSIS — M6281 Muscle weakness (generalized): Secondary | ICD-10-CM | POA: Diagnosis not present

## 2016-09-07 DIAGNOSIS — R2681 Unsteadiness on feet: Secondary | ICD-10-CM | POA: Diagnosis not present

## 2016-09-08 DIAGNOSIS — R488 Other symbolic dysfunctions: Secondary | ICD-10-CM | POA: Diagnosis not present

## 2016-09-08 DIAGNOSIS — R2681 Unsteadiness on feet: Secondary | ICD-10-CM | POA: Diagnosis not present

## 2016-09-08 DIAGNOSIS — N3946 Mixed incontinence: Secondary | ICD-10-CM | POA: Diagnosis not present

## 2016-09-08 DIAGNOSIS — M6281 Muscle weakness (generalized): Secondary | ICD-10-CM | POA: Diagnosis not present

## 2016-09-09 DIAGNOSIS — R488 Other symbolic dysfunctions: Secondary | ICD-10-CM | POA: Diagnosis not present

## 2016-09-09 DIAGNOSIS — R2681 Unsteadiness on feet: Secondary | ICD-10-CM | POA: Diagnosis not present

## 2016-09-09 DIAGNOSIS — N3946 Mixed incontinence: Secondary | ICD-10-CM | POA: Diagnosis not present

## 2016-09-09 DIAGNOSIS — M6281 Muscle weakness (generalized): Secondary | ICD-10-CM | POA: Diagnosis not present

## 2016-09-14 DIAGNOSIS — R488 Other symbolic dysfunctions: Secondary | ICD-10-CM | POA: Diagnosis not present

## 2016-09-14 DIAGNOSIS — M6281 Muscle weakness (generalized): Secondary | ICD-10-CM | POA: Diagnosis not present

## 2016-09-14 DIAGNOSIS — N3946 Mixed incontinence: Secondary | ICD-10-CM | POA: Diagnosis not present

## 2016-09-14 DIAGNOSIS — R2681 Unsteadiness on feet: Secondary | ICD-10-CM | POA: Diagnosis not present

## 2016-09-15 DIAGNOSIS — N3946 Mixed incontinence: Secondary | ICD-10-CM | POA: Diagnosis not present

## 2016-09-15 DIAGNOSIS — M6281 Muscle weakness (generalized): Secondary | ICD-10-CM | POA: Diagnosis not present

## 2016-09-15 DIAGNOSIS — R2681 Unsteadiness on feet: Secondary | ICD-10-CM | POA: Diagnosis not present

## 2016-09-15 DIAGNOSIS — R488 Other symbolic dysfunctions: Secondary | ICD-10-CM | POA: Diagnosis not present

## 2016-09-16 DIAGNOSIS — R2681 Unsteadiness on feet: Secondary | ICD-10-CM | POA: Diagnosis not present

## 2016-09-16 DIAGNOSIS — R488 Other symbolic dysfunctions: Secondary | ICD-10-CM | POA: Diagnosis not present

## 2016-09-16 DIAGNOSIS — M6281 Muscle weakness (generalized): Secondary | ICD-10-CM | POA: Diagnosis not present

## 2016-09-16 DIAGNOSIS — N3946 Mixed incontinence: Secondary | ICD-10-CM | POA: Diagnosis not present

## 2016-09-18 DIAGNOSIS — R2681 Unsteadiness on feet: Secondary | ICD-10-CM | POA: Diagnosis not present

## 2016-09-18 DIAGNOSIS — R488 Other symbolic dysfunctions: Secondary | ICD-10-CM | POA: Diagnosis not present

## 2016-09-18 DIAGNOSIS — N3946 Mixed incontinence: Secondary | ICD-10-CM | POA: Diagnosis not present

## 2016-09-18 DIAGNOSIS — M6281 Muscle weakness (generalized): Secondary | ICD-10-CM | POA: Diagnosis not present

## 2016-09-22 DIAGNOSIS — R488 Other symbolic dysfunctions: Secondary | ICD-10-CM | POA: Diagnosis not present

## 2016-09-22 DIAGNOSIS — R2681 Unsteadiness on feet: Secondary | ICD-10-CM | POA: Diagnosis not present

## 2016-09-22 DIAGNOSIS — M6281 Muscle weakness (generalized): Secondary | ICD-10-CM | POA: Diagnosis not present

## 2016-09-22 DIAGNOSIS — N3946 Mixed incontinence: Secondary | ICD-10-CM | POA: Diagnosis not present

## 2016-09-23 DIAGNOSIS — N3946 Mixed incontinence: Secondary | ICD-10-CM | POA: Diagnosis not present

## 2016-09-23 DIAGNOSIS — R488 Other symbolic dysfunctions: Secondary | ICD-10-CM | POA: Diagnosis not present

## 2016-09-23 DIAGNOSIS — R2681 Unsteadiness on feet: Secondary | ICD-10-CM | POA: Diagnosis not present

## 2016-09-23 DIAGNOSIS — M6281 Muscle weakness (generalized): Secondary | ICD-10-CM | POA: Diagnosis not present

## 2016-09-25 DIAGNOSIS — R488 Other symbolic dysfunctions: Secondary | ICD-10-CM | POA: Diagnosis not present

## 2016-09-25 DIAGNOSIS — N3946 Mixed incontinence: Secondary | ICD-10-CM | POA: Diagnosis not present

## 2016-09-25 DIAGNOSIS — R2681 Unsteadiness on feet: Secondary | ICD-10-CM | POA: Diagnosis not present

## 2016-09-25 DIAGNOSIS — M6281 Muscle weakness (generalized): Secondary | ICD-10-CM | POA: Diagnosis not present

## 2016-09-28 DIAGNOSIS — R488 Other symbolic dysfunctions: Secondary | ICD-10-CM | POA: Diagnosis not present

## 2016-09-28 DIAGNOSIS — N3946 Mixed incontinence: Secondary | ICD-10-CM | POA: Diagnosis not present

## 2016-09-28 DIAGNOSIS — M6281 Muscle weakness (generalized): Secondary | ICD-10-CM | POA: Diagnosis not present

## 2016-09-28 DIAGNOSIS — R2681 Unsteadiness on feet: Secondary | ICD-10-CM | POA: Diagnosis not present

## 2016-09-29 DIAGNOSIS — N3946 Mixed incontinence: Secondary | ICD-10-CM | POA: Diagnosis not present

## 2016-09-29 DIAGNOSIS — R2681 Unsteadiness on feet: Secondary | ICD-10-CM | POA: Diagnosis not present

## 2016-09-29 DIAGNOSIS — R488 Other symbolic dysfunctions: Secondary | ICD-10-CM | POA: Diagnosis not present

## 2016-09-29 DIAGNOSIS — M6281 Muscle weakness (generalized): Secondary | ICD-10-CM | POA: Diagnosis not present

## 2016-09-30 DIAGNOSIS — N3946 Mixed incontinence: Secondary | ICD-10-CM | POA: Diagnosis not present

## 2016-09-30 DIAGNOSIS — R2681 Unsteadiness on feet: Secondary | ICD-10-CM | POA: Diagnosis not present

## 2016-09-30 DIAGNOSIS — M6281 Muscle weakness (generalized): Secondary | ICD-10-CM | POA: Diagnosis not present

## 2016-09-30 DIAGNOSIS — R488 Other symbolic dysfunctions: Secondary | ICD-10-CM | POA: Diagnosis not present

## 2016-10-02 DIAGNOSIS — M6281 Muscle weakness (generalized): Secondary | ICD-10-CM | POA: Diagnosis not present

## 2016-10-02 DIAGNOSIS — R2681 Unsteadiness on feet: Secondary | ICD-10-CM | POA: Diagnosis not present

## 2016-10-02 DIAGNOSIS — N3946 Mixed incontinence: Secondary | ICD-10-CM | POA: Diagnosis not present

## 2016-10-02 DIAGNOSIS — R488 Other symbolic dysfunctions: Secondary | ICD-10-CM | POA: Diagnosis not present

## 2016-10-05 DIAGNOSIS — R488 Other symbolic dysfunctions: Secondary | ICD-10-CM | POA: Diagnosis not present

## 2016-10-05 DIAGNOSIS — M6281 Muscle weakness (generalized): Secondary | ICD-10-CM | POA: Diagnosis not present

## 2016-10-05 DIAGNOSIS — R2681 Unsteadiness on feet: Secondary | ICD-10-CM | POA: Diagnosis not present

## 2016-10-05 DIAGNOSIS — N3946 Mixed incontinence: Secondary | ICD-10-CM | POA: Diagnosis not present

## 2016-10-06 DIAGNOSIS — R2681 Unsteadiness on feet: Secondary | ICD-10-CM | POA: Diagnosis not present

## 2016-10-06 DIAGNOSIS — M6281 Muscle weakness (generalized): Secondary | ICD-10-CM | POA: Diagnosis not present

## 2016-10-06 DIAGNOSIS — N3946 Mixed incontinence: Secondary | ICD-10-CM | POA: Diagnosis not present

## 2016-10-06 DIAGNOSIS — R488 Other symbolic dysfunctions: Secondary | ICD-10-CM | POA: Diagnosis not present

## 2016-10-07 DIAGNOSIS — R488 Other symbolic dysfunctions: Secondary | ICD-10-CM | POA: Diagnosis not present

## 2016-10-07 DIAGNOSIS — M6281 Muscle weakness (generalized): Secondary | ICD-10-CM | POA: Diagnosis not present

## 2016-10-07 DIAGNOSIS — N3946 Mixed incontinence: Secondary | ICD-10-CM | POA: Diagnosis not present

## 2016-10-07 DIAGNOSIS — R2681 Unsteadiness on feet: Secondary | ICD-10-CM | POA: Diagnosis not present

## 2016-10-13 DIAGNOSIS — N3946 Mixed incontinence: Secondary | ICD-10-CM | POA: Diagnosis not present

## 2016-10-13 DIAGNOSIS — R488 Other symbolic dysfunctions: Secondary | ICD-10-CM | POA: Diagnosis not present

## 2016-10-13 DIAGNOSIS — M6281 Muscle weakness (generalized): Secondary | ICD-10-CM | POA: Diagnosis not present

## 2016-10-13 DIAGNOSIS — R2681 Unsteadiness on feet: Secondary | ICD-10-CM | POA: Diagnosis not present

## 2016-10-15 DIAGNOSIS — N3946 Mixed incontinence: Secondary | ICD-10-CM | POA: Diagnosis not present

## 2016-10-15 DIAGNOSIS — R2681 Unsteadiness on feet: Secondary | ICD-10-CM | POA: Diagnosis not present

## 2016-10-15 DIAGNOSIS — M6281 Muscle weakness (generalized): Secondary | ICD-10-CM | POA: Diagnosis not present

## 2016-10-15 DIAGNOSIS — R488 Other symbolic dysfunctions: Secondary | ICD-10-CM | POA: Diagnosis not present

## 2016-10-16 DIAGNOSIS — R2681 Unsteadiness on feet: Secondary | ICD-10-CM | POA: Diagnosis not present

## 2016-10-16 DIAGNOSIS — M6281 Muscle weakness (generalized): Secondary | ICD-10-CM | POA: Diagnosis not present

## 2016-10-16 DIAGNOSIS — N3946 Mixed incontinence: Secondary | ICD-10-CM | POA: Diagnosis not present

## 2016-10-16 DIAGNOSIS — R488 Other symbolic dysfunctions: Secondary | ICD-10-CM | POA: Diagnosis not present

## 2017-03-22 ENCOUNTER — Encounter: Payer: Self-pay | Admitting: Neurology

## 2017-03-22 ENCOUNTER — Ambulatory Visit: Payer: Medicare Other | Admitting: Neurology

## 2017-03-22 VITALS — BP 138/78 | HR 87 | Ht 62.0 in | Wt 128.0 lb

## 2017-03-22 DIAGNOSIS — F0280 Dementia in other diseases classified elsewhere without behavioral disturbance: Secondary | ICD-10-CM | POA: Diagnosis not present

## 2017-03-22 DIAGNOSIS — G301 Alzheimer's disease with late onset: Secondary | ICD-10-CM | POA: Diagnosis not present

## 2017-03-22 NOTE — Progress Notes (Signed)
Reason for visit: Alzheimer's disease  Michelle Savage is an 84 y.o. female  History of present illness:  Michelle Savage is a 84 year old right-handed white female with a history of progressive memory disorder consistent with Alzheimer's disease.  The patient is still physically in good shape, she did fall in March 2018 and fractured her ankle.  The patient has recovered from this well.  She remains on Aricept and Namenda.  She has had some episodes of diarrhea and urinary incontinence that may occur on occasion.  The patient is using alprazolam at times when she gets agitated.  In general, she sleeps fairly well at night.  She had been losing some weight but now is eating well and gaining weight again.  She returns to this office for an evaluation.  The patient is residing at [facility].  Past Medical History:  Diagnosis Date  . Alzheimer disease   . Anemia   . Aortic insufficiency   . Depression   . HOH (hard of hearing)   . Hypertension   . Memory loss   . Mitral valve regurgitation   . Vitamin D deficiency     Past Surgical History:  Procedure Laterality Date  . CHOLECYSTECTOMY    . TONSILLECTOMY      Family History  Problem Relation Age of Onset  . Lung cancer Mother   . Dementia Sister   . Stroke Sister     Social history:  reports that  has never smoked. she has never used smokeless tobacco. She reports that she does not drink alcohol or use drugs.    Allergies  Allergen Reactions  . Penicillins Rash    Medications:  Prior to Admission medications   Medication Sig Start Date End Date Taking? Authorizing Provider  aspirin 81 MG tablet Take 81 mg by mouth daily.   Yes [provider]  donepezil (ARICEPT) 23 MG TABS tablet Take 1 tablet (23 mg total) by mouth at bedtime. 03/19/14  Yes York Spaniel, MD  memantine (NAMENDA) 10 MG tablet Take 1 tablet (10 mg total) by mouth 2 (two) times daily. 03/19/14  Yes York Spaniel, MD  sertraline  (ZOLOFT) 100 MG tablet  03/11/16  Yes [provider]    ROS:  Out of a complete 14 system review of symptoms, the patient complains only of the following symptoms, and all other reviewed systems are negative.  Hearing loss Daytime sleepiness Memory loss Agitation, confusion  Blood pressure 138/78, pulse 87, height 5\' 2"  (1.575 m), weight 128 lb (58.1 kg), SpO2 95 %.  Physical Exam  General: The patient is alert and cooperative at the time of the examination.  The patient is hard of hearing, she has bilateral hearing aids  Skin: No significant peripheral edema is noted.   Neurologic Exam  Mental status: The patient is alert and oriented x 2 at the time of the examination, she is not oriented to date.    Cranial nerves: Facial symmetry is present. Speech is normal, no aphasia or dysarthria is noted. Extraocular movements are full. Visual fields are full.  Motor: The patient has good strength in all 4 extremities.  Sensory examination: Soft touch sensation is symmetric on the face, arms, and legs.  Coordination: The patient has good finger-nose-finger and heel-to-shin bilaterally.  Some apraxia with use of the extremities is noted.  Gait and station: The patient has a normal gait. Romberg is negative. No drift is seen.  Reflexes: Deep tendon reflexes are  symmetric.   Assessment/Plan:  1.  Alzheimer's disease  I discussed the potential side effects of Aricept with the daughter.  If the patient has worsening urinary incontinence or diarrhea this may be related to the use of Aricept, a reduction in dose may be needed.  They need to watch out for recurring weight loss.  The patient will otherwise follow-up in 1 year.  Michelle Palau. Keith Baraka Klatt MD 03/22/2017 3:11 PM  [facility] [address] [place], [place] [id number]  Phone [phone] Fax [phone]

## 2017-04-26 ENCOUNTER — Emergency Department (HOSPITAL_COMMUNITY): Payer: Medicare Other

## 2017-04-26 ENCOUNTER — Encounter (HOSPITAL_COMMUNITY): Payer: Self-pay | Admitting: *Deleted

## 2017-04-26 ENCOUNTER — Observation Stay (HOSPITAL_COMMUNITY)
Admission: EM | Admit: 2017-04-26 | Discharge: 2017-04-27 | Disposition: A | Discharge status: Home / Self Care | Payer: Medicare Other | Attending: Internal Medicine | Admitting: Internal Medicine

## 2017-04-26 ENCOUNTER — Other Ambulatory Visit: Payer: Self-pay

## 2017-04-26 DIAGNOSIS — R0603 Acute respiratory distress: Secondary | ICD-10-CM | POA: Insufficient documentation

## 2017-04-26 DIAGNOSIS — F0281 Dementia in other diseases classified elsewhere with behavioral disturbance: Secondary | ICD-10-CM | POA: Diagnosis not present

## 2017-04-26 DIAGNOSIS — R739 Hyperglycemia, unspecified: Secondary | ICD-10-CM | POA: Diagnosis present

## 2017-04-26 DIAGNOSIS — Z79899 Other long term (current) drug therapy: Secondary | ICD-10-CM | POA: Insufficient documentation

## 2017-04-26 DIAGNOSIS — Y92009 Unspecified place in unspecified non-institutional (private) residence as the place of occurrence of the external cause: Secondary | ICD-10-CM | POA: Insufficient documentation

## 2017-04-26 DIAGNOSIS — Y998 Other external cause status: Secondary | ICD-10-CM | POA: Diagnosis not present

## 2017-04-26 DIAGNOSIS — F0390 Unspecified dementia without behavioral disturbance: Secondary | ICD-10-CM | POA: Diagnosis not present

## 2017-04-26 DIAGNOSIS — R0989 Other specified symptoms and signs involving the circulatory and respiratory systems: Secondary | ICD-10-CM | POA: Insufficient documentation

## 2017-04-26 DIAGNOSIS — F329 Major depressive disorder, single episode, unspecified: Secondary | ICD-10-CM

## 2017-04-26 DIAGNOSIS — T17908A Unspecified foreign body in respiratory tract, part unspecified causing other injury, initial encounter: Secondary | ICD-10-CM | POA: Diagnosis not present

## 2017-04-26 DIAGNOSIS — Z7982 Long term (current) use of aspirin: Secondary | ICD-10-CM | POA: Diagnosis not present

## 2017-04-26 DIAGNOSIS — F0280 Dementia in other diseases classified elsewhere without behavioral disturbance: Secondary | ICD-10-CM | POA: Diagnosis not present

## 2017-04-26 DIAGNOSIS — R0602 Shortness of breath: Secondary | ICD-10-CM | POA: Diagnosis not present

## 2017-04-26 DIAGNOSIS — Y33XXXA Other specified events, undetermined intent, initial encounter: Secondary | ICD-10-CM | POA: Insufficient documentation

## 2017-04-26 DIAGNOSIS — G301 Alzheimer's disease with late onset: Secondary | ICD-10-CM | POA: Diagnosis not present

## 2017-04-26 DIAGNOSIS — G309 Alzheimer's disease, unspecified: Secondary | ICD-10-CM | POA: Diagnosis not present

## 2017-04-26 DIAGNOSIS — Y9389 Activity, other specified: Secondary | ICD-10-CM | POA: Diagnosis not present

## 2017-04-26 DIAGNOSIS — I10 Essential (primary) hypertension: Secondary | ICD-10-CM | POA: Diagnosis not present

## 2017-04-26 DIAGNOSIS — G459 Transient cerebral ischemic attack, unspecified: Secondary | ICD-10-CM | POA: Diagnosis present

## 2017-04-26 DIAGNOSIS — R413 Other amnesia: Secondary | ICD-10-CM | POA: Diagnosis present

## 2017-04-26 DIAGNOSIS — F32A Depression, unspecified: Secondary | ICD-10-CM

## 2017-04-26 DIAGNOSIS — Z66 Do not resuscitate: Secondary | ICD-10-CM

## 2017-04-26 DIAGNOSIS — J45909 Unspecified asthma, uncomplicated: Secondary | ICD-10-CM | POA: Diagnosis not present

## 2017-04-26 DIAGNOSIS — Z515 Encounter for palliative care: Secondary | ICD-10-CM

## 2017-04-26 DIAGNOSIS — R531 Weakness: Secondary | ICD-10-CM

## 2017-04-26 DIAGNOSIS — R0682 Tachypnea, not elsewhere classified: Secondary | ICD-10-CM | POA: Diagnosis not present

## 2017-04-26 LAB — COMPREHENSIVE METABOLIC PANEL
ALK PHOS: 95 U/L (ref 38–126)
ALT: 23 U/L (ref 14–54)
ANION GAP: 12 (ref 5–15)
AST: 38 U/L (ref 15–41)
Albumin: 3.3 g/dL — ABNORMAL LOW (ref 3.5–5.0)
BUN: 13 mg/dL (ref 6–20)
CO2: 24 mmol/L (ref 22–32)
Calcium: 8.8 mg/dL — ABNORMAL LOW (ref 8.9–10.3)
Chloride: 106 mmol/L (ref 101–111)
Creatinine, Ser: 0.9 mg/dL (ref 0.44–1.00)
GFR calc Af Amer: >60 mL/min (ref >60)
GFR calc non Af Amer: 54 mL/min — ABNORMAL LOW (ref >60)
GLUCOSE: 219 mg/dL — AB (ref 65–99)
POTASSIUM: 3.5 mmol/L (ref 3.5–5.1)
SODIUM: 142 mmol/L (ref 135–145)
Total Bilirubin: 0.8 mg/dL (ref 0.3–1.2)
Total Protein: 6.9 g/dL (ref 6.5–8.1)

## 2017-04-26 LAB — I-STAT CG4 LACTIC ACID, ED
Lactic Acid, Venous: 2.76 mmol/L (ref 0.5–1.9)
Lactic Acid, Venous: 2.58 mmol/L (ref 0.5–1.9)

## 2017-04-26 LAB — URINALYSIS, ROUTINE W REFLEX MICROSCOPIC
BILIRUBIN URINE: NEGATIVE
Glucose, UA: NEGATIVE mg/dL
Hgb urine dipstick: NEGATIVE
KETONES UR: 5 mg/dL — AB
Leukocytes, UA: NEGATIVE
Nitrite: NEGATIVE
PROTEIN: 100 mg/dL — AB
Specific Gravity, Urine: 1.023 (ref 1.005–1.030)
pH: 5 (ref 5.0–8.0)

## 2017-04-26 LAB — LACTIC ACID, PLASMA
LACTIC ACID, VENOUS: 2.1 mmol/L — AB (ref 0.5–1.9)
Lactic Acid, Venous: 1.4 mmol/L (ref 0.5–1.9)

## 2017-04-26 LAB — CBC WITH DIFFERENTIAL/PLATELET
BASOS PCT: 0 %
Basophils Absolute: 0 10*3/uL (ref 0.0–0.1)
Eosinophils Absolute: 0 10*3/uL (ref 0.0–0.7)
Eosinophils Relative: 0 %
HCT: 46.1 % — ABNORMAL HIGH (ref 36.0–46.0)
HEMOGLOBIN: 14.4 g/dL (ref 12.0–15.0)
Lymphocytes Relative: 7 %
Lymphs Abs: 0.8 10*3/uL (ref 0.7–4.0)
MCH: 30.1 pg (ref 26.0–34.0)
MCHC: 31.2 g/dL (ref 30.0–36.0)
MCV: 96.2 fL (ref 78.0–100.0)
MONO ABS: 0.2 10*3/uL (ref 0.1–1.0)
Monocytes Relative: 2 %
NEUTROS ABS: 10 10*3/uL — AB (ref 1.7–7.7)
Neutrophils Relative %: 91 %
Platelets: 152 10*3/uL (ref 150–400)
RBC: 4.79 MIL/uL (ref 3.87–5.11)
RDW: 14.9 % (ref 11.5–15.5)
WBC: 11 10*3/uL — AB (ref 4.0–10.5)

## 2017-04-26 LAB — I-STAT TROPONIN, ED: TROPONIN I, POC: 0.05 ng/mL (ref 0.00–0.08)

## 2017-04-26 LAB — CBG MONITORING, ED: GLUCOSE-CAPILLARY: 212 mg/dL — AB (ref 65–99)

## 2017-04-26 LAB — PROCALCITONIN

## 2017-04-26 LAB — BRAIN NATRIURETIC PEPTIDE: B Natriuretic Peptide: 367.1 pg/mL — ABNORMAL HIGH (ref 0.0–100.0)

## 2017-04-26 MED ORDER — ONDANSETRON HCL 4 MG PO TABS: 4.0000 mg | ORAL_TABLET | Freq: Four times a day (QID) | ORAL | Status: DC | PRN

## 2017-04-26 MED ORDER — IPRATROPIUM-ALBUTEROL 0.5-2.5 (3) MG/3ML IN SOLN
3.0000 mL | Freq: Four times a day (QID) | RESPIRATORY_TRACT | Status: DC
  Administered 2017-04-27: 3 mL via RESPIRATORY_TRACT
  Filled 2017-04-26: qty 3

## 2017-04-26 MED ORDER — ONDANSETRON HCL 4 MG/2ML IJ SOLN: 4.0000 mg | Freq: Four times a day (QID) | INTRAMUSCULAR | Status: DC | PRN

## 2017-04-26 MED ORDER — HALOPERIDOL LACTATE 5 MG/ML IJ SOLN
0.5000 mg | Freq: Four times a day (QID) | INTRAMUSCULAR | Status: DC | PRN
  Administered 2017-04-27: 0.5 mg via INTRAVENOUS
  Filled 2017-04-26: qty 1

## 2017-04-26 MED ORDER — IPRATROPIUM-ALBUTEROL 0.5-2.5 (3) MG/3ML IN SOLN
3.0000 mL | RESPIRATORY_TRACT | Status: DC
  Administered 2017-04-26: 3 mL via RESPIRATORY_TRACT
  Filled 2017-04-26: qty 3

## 2017-04-26 MED ORDER — ACETAMINOPHEN 325 MG PO TABS: 650.0000 mg | ORAL_TABLET | Freq: Four times a day (QID) | ORAL | Status: DC | PRN

## 2017-04-26 MED ORDER — ACETAMINOPHEN 650 MG RE SUPP: 650.0000 mg | Freq: Four times a day (QID) | RECTAL | Status: DC | PRN

## 2017-04-26 MED ORDER — BISACODYL 10 MG RE SUPP: 10.0000 mg | Freq: Every day | RECTAL | Status: DC | PRN

## 2017-04-26 MED ORDER — ALBUTEROL SULFATE (2.5 MG/3ML) 0.083% IN NEBU: 2.5000 mg | INHALATION_SOLUTION | RESPIRATORY_TRACT | Status: DC | PRN

## 2017-04-26 MED ORDER — ENOXAPARIN SODIUM 40 MG/0.4ML ~~LOC~~ SOLN: 40.0000 mg | SUBCUTANEOUS | Status: DC

## 2017-04-26 MED ORDER — SODIUM CHLORIDE 0.9 % IV SOLN
INTRAVENOUS | Status: DC
  Administered 2017-04-26: 18:00:00 via INTRAVENOUS

## 2017-04-26 MED ORDER — HALOPERIDOL LACTATE 5 MG/ML IJ SOLN
0.5000 mg | Freq: Once | INTRAMUSCULAR | Status: AC
  Administered 2017-04-26: 0.5 mg via INTRAVENOUS
  Filled 2017-04-26: qty 1

## 2017-04-26 NOTE — ED Notes (Signed)
Patient was incont. Of stool  Cleaned patient.

## 2017-04-26 NOTE — ED Notes (Signed)
Pt CBG was 212, notified Kelly(RN)

## 2017-04-26 NOTE — ED Notes (Signed)
Pulmonologist at bedside

## 2017-04-26 NOTE — Progress Notes (Signed)
Patient received on floor with daughter at bedside.  Patient and family orientated to floor and unit policy.  Patient stable at this time; family has no additional questions. Will continue to monitor patient.

## 2017-04-26 NOTE — Progress Notes (Signed)
Called into room by daughter for mothers agitation.  Patient would not wear cpap mask and became agitated.  Placed 2liters of oxygen via nasal canula patient was more tolerable of this device.  Patient has had on but is now becoming agitated more frequently and no redirectable.

## 2017-04-26 NOTE — ED Notes (Signed)
Patient returned from X-ray 

## 2017-04-26 NOTE — ED Provider Notes (Signed)
[facility] EMERGENCY DEPARTMENT Provider Note   CSN: [id number] Arrival date & time: 04/26/17  1204     History   Chief Complaint No chief complaint on file. SOB/?choking  HPI Michelle Savage is a 84 y.o. female.  The history is provided by the EMS personnel, a relative and medical records.  Shortness of Breath  This is a new problem. The average episode lasts 1 hour. The problem occurs continuously.The current episode started less than 1 hour ago. The problem has been rapidly improving. Pertinent negatives include no fever, no cough, no wheezing, no chest pain, no syncope, no vomiting, no abdominal pain and no leg pain. She has tried beta-agonist inhalers for the symptoms. The treatment provided moderate relief. Associated medical issues do not include asthma, COPD, CAD or recent surgery.   LVL 5 caveat for Dementia   Past Medical History:  Diagnosis Date  . Alzheimer disease   . Anemia   . Aortic insufficiency   . Depression   . HOH (hard of hearing)   . Hypertension   . Memory loss   . Mitral valve regurgitation   . Vitamin D deficiency     Patient Active Problem List   Diagnosis Date Noted  . Memory deficit 10/08/2012  . TIA (transient ischemic attack) 12/30/2010  . Dementia 12/30/2010  . Hyperglycemia 12/30/2010  . HYPERTENSION 01/15/2009    Past Surgical History:  Procedure Laterality Date  . CHOLECYSTECTOMY    . TONSILLECTOMY      OB History    No data available       Home Medications    Prior to Admission medications   Medication Sig Start Date End Date Taking? Authorizing Provider  aspirin 81 MG tablet Take 81 mg by mouth daily.    [provider]  donepezil (ARICEPT) 23 MG TABS tablet Take 1 tablet (23 mg total) by mouth at bedtime. 03/19/14   York Spaniel, MD  memantine (NAMENDA) 10 MG tablet Take 1 tablet (10 mg total) by mouth 2 (two) times daily. 03/19/14   York Spaniel, MD  sertraline (ZOLOFT) 100  MG tablet  03/11/16   [provider]    Family History Family History  Problem Relation Age of Onset  . Lung cancer Mother   . Dementia Sister   . Stroke Sister     Social History Social History   Tobacco Use  . Smoking status: Never Smoker  . Smokeless tobacco: Never Used  Substance Use Topics  . Alcohol use: No    Alcohol/week: 0.0 oz  . Drug use: No     Allergies   Penicillins   Review of Systems Review of Systems  Unable to perform ROS: Dementia  Constitutional: Negative for fever.  Respiratory: Positive for shortness of breath. Negative for cough and wheezing.   Cardiovascular: Negative for chest pain and syncope.  Gastrointestinal: Negative for abdominal pain and vomiting.     Physical Exam Updated Vital Signs There were no vitals taken for this visit.  Physical Exam  Constitutional: She appears well-developed and well-nourished. No distress.  HENT:  Head: Normocephalic.  Mouth/Throat: Oropharynx is clear and moist.  Eyes: Conjunctivae are normal. Pupils are equal, round, and reactive to light.  Neck: Normal range of motion.  Cardiovascular: Normal rate.  No murmur heard. Pulmonary/Chest: No stridor. She is in respiratory distress. She has no wheezes. She has rales. She exhibits no tenderness.  Abdominal: Soft. There is no tenderness.  Musculoskeletal: She exhibits  no edema or tenderness.  Neurological: She is alert. She exhibits normal muscle tone.  Skin: Capillary refill takes less than 2 seconds. No rash noted. She is not diaphoretic. No erythema.  Nursing note and vitals reviewed.    ED Treatments / Results  Labs (all labs ordered are listed, but only abnormal results are displayed) Labs Reviewed  CBC WITH DIFFERENTIAL/PLATELET - Abnormal; Notable for the following components:      Result Value   WBC 11.0 (*)    HCT 46.1 (*)    Neutro Abs 10.0 (*)    All other components within normal limits  COMPREHENSIVE METABOLIC PANEL -  Abnormal; Notable for the following components:   Glucose, Bld 219 (*)    Calcium 8.8 (*)    Albumin 3.3 (*)    GFR calc non Af Amer 54 (*)    All other components within normal limits  BRAIN NATRIURETIC PEPTIDE - Abnormal; Notable for the following components:   B Natriuretic Peptide 367.1 (*)    All other components within normal limits  URINALYSIS, ROUTINE W REFLEX MICROSCOPIC - Abnormal; Notable for the following components:   APPearance CLOUDY (*)    Ketones, ur 5 (*)    Protein, ur 100 (*)    Bacteria, UA MANY (*)    Squamous Epithelial / LPF 0-5 (*)    All other components within normal limits  LACTIC ACID, PLASMA - Abnormal; Notable for the following components:   Lactic Acid, Venous 2.1 (*)    All other components within normal limits  I-STAT CG4 LACTIC ACID, ED - Abnormal; Notable for the following components:   Lactic Acid, Venous 2.76 (*)    All other components within normal limits  CBG MONITORING, ED - Abnormal; Notable for the following components:   Glucose-Capillary 212 (*)    All other components within normal limits  I-STAT CG4 LACTIC ACID, ED - Abnormal; Notable for the following components:   Lactic Acid, Venous 2.58 (*)    All other components within normal limits  PROCALCITONIN  LACTIC ACID, PLASMA  I-STAT TROPONIN, ED    EKG  EKG Interpretation  Date/Time:  Monday April 26 2017 12:07:21 EDT Ventricular Rate:  65 PR Interval:    QRS Duration: 94 QT Interval:  407 QTC Calculation: 424 R Axis:   56 Text Interpretation:  Sinus rhythm Multiple premature complexes, vent & supraven Consider anterior infarct sinus rhythm. When compared to prior, more PVC.  No STEMI Confirmed by Theda Belfastegeler, Chris ([id number]) on 04/26/2017 12:47:28 PM       Radiology Dg Chest 2 View  Result Date: 04/26/2017 CLINICAL DATA:  Shortness of breath.  Alzheimer's dementia EXAM: CHEST - 2 VIEW COMPARISON:  December 31, 2010 FINDINGS: There is interstitial thickening in the mid and  lower lung zones. There is no edema or consolidation. The heart is mildly enlarged with pulmonary vascularity within normal limits. No adenopathy. There is aortic atherosclerosis. No bone lesions. IMPRESSION: Interstitial thickening in the mid lower lung zones, probably due to underlying fibrotic type change. No frank edema or consolidation. Heart mildly enlarged. There is aortic atherosclerosis. No evident adenopathy. Aortic Atherosclerosis (ICD10-I70.0). Electronically Signed   By: Bretta BangWilliam  Woodruff III M.D.   On: 04/26/2017 13:16   Dg Chest Port 1 View  Result Date: 04/27/2017 CLINICAL DATA:  Aspiration. EXAM: PORTABLE CHEST 1 VIEW COMPARISON:  04/26/2017 FINDINGS: The heart is enlarged but stable. Stable tortuosity of the thoracic aorta. Persistent bibasilar opacities but slight improved basilar aeration. No pleural  effusions or pulmonary edema. IMPRESSION: Persistent but slightly improved bibasilar lung opacities. Electronically Signed   By: Rudie Meyer M.D.   On: 04/27/2017 09:26    Procedures Procedures (including critical care time)  Medications Ordered in ED Medications  bisacodyl (DULCOLAX) suppository 10 mg (not administered)  ondansetron (ZOFRAN) tablet 4 mg (not administered)    Or  ondansetron (ZOFRAN) injection 4 mg (not administered)  enoxaparin (LOVENOX) injection 40 mg (40 mg Subcutaneous Not Given 04/26/17 2225)  0.9 %  sodium chloride infusion ( Intravenous New Bag/Given 04/26/17 1740)  acetaminophen (TYLENOL) tablet 650 mg (not administered)    Or  acetaminophen (TYLENOL) suppository 650 mg (not administered)  albuterol (PROVENTIL) (2.5 MG/3ML) 0.083% nebulizer solution 2.5 mg (not administered)  ipratropium-albuterol (DUONEB) 0.5-2.5 (3) MG/3ML nebulizer solution 3 mL (3 mLs Nebulization Given 04/27/17 0931)  haloperidol lactate (HALDOL) injection 0.5 mg (0.5 mg Intravenous Given 04/27/17 0010)  haloperidol lactate (HALDOL) injection 0.5 mg (0.5 mg Intravenous Given  04/26/17 1611)     Initial Impression / Assessment and Plan / ED Course  I have reviewed the triage vital signs and the nursing notes.  Pertinent labs & imaging results that were available during my care of the patient were reviewed by me and considered in my medical decision making (see chart for details).     Zarra Geffert is a 84 y.o. female with a past medical history significant for hypertension, TIA, and severe dementia who presents with shortness of breath.  According to EMS, patient had just finished eating breakfast when she had sudden onset difficulty breathing.  They are unsure if patient choked on something as patient is unable to describe the episode.  The EMS team reports that she was gurgling and coughing up fluids.  They denied hemoptysis but reported that they had to place the patient on CPAP.  They then report that they almost had to bag the patient due to difficulty breathing.  They were unable to get a good oxygen saturation on the patient however they were able to give her a DuoNeb breathing treatment x2 as well as IV Solu-Medrol.  They report that they were concerned about pulmonary edema given the frothy coughing.  Family came to the bedside and was able to provide further history.  They report that she has otherwise been doing well with no recent fevers, chills, cough, congestion, or other symptoms.  On my exam, patient is very agitated and yelling at staff.  Patient moving all extremities and her vigorous attempts at pulling off leads.  Patient is disoriented.  Patient's lungs had some crackles in the bases but did not hear significant wheezing or rhonchi.  Chest nontender and abdomen nontender.  Minimal edema in the legs.  Symmetric pulses in upper extremities.  Based on the daughter's report that she was doing well recently and then the episode happened shortly after breakfast I am concerned patient may have aspirated or choked on something she was eating.  Patient  will have laboratory testing, chest x-ray, and workup to look for cardiac etiology of symptoms.  Patient's oxygen saturations improved into the low 90s however she was having readings in the 80s occasionally.  Daughter is very concerned that the patient had choked and aspirated.  Anticipate discussing with pulmonology after workup if no significant abnormalities are seen to help workup possible aspiration.     3:02 PM Pulmonology feels that she needs to be admitted overnight given how poorly she was breathing earlier.  He agrees that  this is likely an aspiration event.  He will come see the patient but recommends admission and discussion of bronchial if she has any worsening of condition.  Patient also found to have elevated BNP however he suspects this may be unrelated given the acuity of her condition.  Next  Patient and family agree with admission.  Final Clinical Impressions(s) / ED Diagnoses   Final diagnoses:  Aspiration into airway    ED Discharge Orders    None      Clinical Impression: 1. Aspiration into airway     Disposition: Admit  This note was prepared with assistance of Dragon voice recognition software. Occasional wrong-word or sound-a-like substitutions may have occurred due to the inherent limitations of voice recognition software.     Hanish Laraia, Canary Brim, MD 04/27/17 [phone]

## 2017-04-26 NOTE — Consult Note (Signed)
PULMONARY / CRITICAL CARE MEDICINE   Name: Michelle Savage MRN: [id number] DOB: 19-Oct-1924    ADMISSION DATE:  04/26/2017 CONSULTATION DATE:  04/26/17  REFERRING MD:  ED physician  CHIEF COMPLAINT:  Choking episode, shortness of b reatgh  HISTORY OF PRESENT ILLNESS:   This is a woman who resides at a nursing home. She became acutely short of breath this Am DURING BREAKFAST. The patient has  No recollection of the incident and cannot furnish additional information due to her advanced dementia. Apparently she requiored suctioning and bagging by the first responders that arrived to bring her to the hospital. It apppaears that there were food particles in her mouth. At present she is yelling and agitatged.She won't  allow me to examine her and is very paranoid.    PAST MEDICAL HISTORY :  She  has a past medical history of Alzheimer disease, Anemia, Aortic insufficiency, Depression, HOH (hard of hearing), Hypertension, Memory loss, Mitral valve regurgitation, and Vitamin D deficiency.  PAST SURGICAL HISTORY: She  has a past surgical history that includes Cholecystectomy and Tonsillectomy.  Allergies  Allergen Reactions  . Penicillins Rash    No current facility-administered medications on file prior to encounter.    Current Outpatient Medications on File Prior to Encounter  Medication Sig  . aspirin 81 MG tablet Take 81 mg by mouth daily.  Marland Kitchen donepezil (ARICEPT) 23 MG TABS tablet Take 1 tablet (23 mg total) by mouth at bedtime.  . memantine (NAMENDA) 10 MG tablet Take 1 tablet (10 mg total) by mouth 2 (two) times daily.  . sertraline (ZOLOFT) 100 MG tablet     FAMILY HISTORY:  Her indicated that her mother is deceased. She indicated that her father is deceased. She indicated that her sister is alive.   SOCIAL HISTORY: She  reports that  has never smoked. she has never used smokeless tobacco. She reports that she does not drink alcohol or use drugs.  REVIEW OF SYSTEMS:   Not  available       VITAL SIGNS: BP 127/67   Pulse 73   Temp (!) 97 F (36.1 C) (Rectal)   Resp (!) 24   Ht 5\' 1"  (1.549 m)   SpO2 95%   BMI 24.19 kg/m   HEMODYNAMICS:    VENTILATOR SETTINGS:    INTAKE / OUTPUT: No intake/output data recorded.  PHYSICAL EXAMINATION: General:  Older woman who is agtated presently and does not appear ill. Neuro:  Awake, alert, disoriented, agitated, moving all extremities. Her speeech is appropriate and cleer. HEENT:  [place]/AT,sclerae naicteric Cardiovascular:  RRRs1s2 Lungs:  Bilateral BS, no gross wheeze or crackles Abdomen:  Soft, BS Musculoskeletal:  No gross abnoormalities Skin: NL  LABS:  BMET Recent Labs  Lab 04/26/17 1216  NA 142  K 3.5  CL 106  CO2 24  BUN 13  CREATININE 0.90  GLUCOSE 219*    Electrolytes Recent Labs  Lab 04/26/17 1216  CALCIUM 8.8*    CBC Recent Labs  Lab 04/26/17 1216  WBC 11.0*  HGB 14.4  HCT 46.1*  PLT 152    Coag's No results for input(s): APTT, INR in the last 168 hours.  Sepsis Markers Recent Labs  Lab 04/26/17 1255 04/26/17 1430  LATICACIDVEN 2.76* 2.58*    ABG No results for input(s): PHART, PCO2ART, PO2ART in the last 168 hours.  Liver Enzymes Recent Labs  Lab 04/26/17 1216  AST 38  ALT 23  ALKPHOS 95  BILITOT 0.8  ALBUMIN 3.3*  Cardiac Enzymes No results for input(s): TROPONINI, PROBNP in the last 168 hours.  Glucose Recent Labs  Lab 04/26/17 1205  GLUCAP 212*    Imaging Dg Chest 2 View  Result Date: 04/26/2017 CLINICAL DATA:  Shortness of breath.  Alzheimer's dementia EXAM: CHEST - 2 VIEW COMPARISON:  December 31, 2010 FINDINGS: There is interstitial thickening in the mid and lower lung zones. There is no edema or consolidation. The heart is mildly enlarged with pulmonary vascularity within normal limits. No adenopathy. There is aortic atherosclerosis. No bone lesions. IMPRESSION: Interstitial thickening in the mid lower lung zones, probably due to  underlying fibrotic type change. No frank edema or consolidation. Heart mildly enlarged. There is aortic atherosclerosis. No evident adenopathy. Aortic Atherosclerosis (ICD10-I70.0). Electronically Signed   By: Bretta BangWilliam  Woodruff III M.D.   On: 04/26/2017 13:16     ASSESSMENT / PLAN:  PULMONARY  It appears likely that the patient has ac hoking episode this AM. her daughter does not recall previous such episodes. Although she looks well presenrtly she apparently was not looking weel a few hours ago and needed vigorous bagging and suction upon her intiial evaluation. Her CXR does not reveal an obvious infiltrate. She does not appear ill.  It may be reasonable to observe the patient overnight given the nature of this event. Apparently , there are no nurses at her facility.  would repeat her CXR ands procal in the AM. I would check her 02 sats periodically. I would  Not start empiric abx at this time unless there were a clinicv al cah nge. I would ask for a formal eval from the speeech department before sending her back to her facility. No clear cut reason for FOB ( if there were a good reason thepatient would need general anaesthesis to accomplish it given her baseline agitation).                  FAMILY   I discussed the situation with the ED physiican and her daughter at the bedside.        Pulmonary and Critical Care Medicine [facility] Pager: [phone]  04/26/2017, 3:24 PM

## 2017-04-26 NOTE — ED Notes (Signed)
Daughter at bedside. States patient is in the memory care unit and is always confused.

## 2017-04-26 NOTE — ED Notes (Signed)
Attempted to call report to 3W.  Chastity states receiving nurse Asher MuirJamie is not ready to get report.

## 2017-04-26 NOTE — ED Notes (Signed)
Hospitalist at bedside 

## 2017-04-26 NOTE — ED Triage Notes (Signed)
Patient presents to ed via [facility] , states they were [facility] Memory side. Upon ems arrival patient had decreased loc. Patient was placed on Cpap upon arrival  Patient was much more alert per ems. Patient is verbal however confused. [facility]

## 2017-04-26 NOTE — H&P (Signed)
History and Physical    Michelle Savage Sunday WJX:[id number] DOB: 11/10/1924 DOA: 04/26/2017   PCP: Lupita RaiderShaw, Kimberlee, MD   Patient coming from:  Home    Chief Complaint: Shortness of breath   HPI: Michelle Savage Fait is a 84 y.o. female with medical history significant for HTN,remote TIA, severe dementia, brought to there ED for evaluation of shortness of breath. In review, patient had just finished eating breakfast, when she developed sudden onset of difficulty breathing.  Suspecting that the patient had choked, EMS was requested.  On presentation, she was gurgling and coughing of fluids.  Nursing staff denied hemoptysis, but reported that the patient had to be placed on CPAP.  She almost had to be evaluated due to difficulty breathing.  He received DuoNeb breathing treatment x2, as well as IV Solu-Medrol EMS.  Given the frothy coughing, EMS was worried about pulmonary edema.  Per chart report, there were no apparent fevers, chills, cough, congestion or other constitutional symptoms.  On presentation, the patient was in room air, with O2 sats now normalized at 95%.  However, she continues to be very agitated, disoriented, and is being admitted for observation. Daughter reports that the patient has lost significant amount of weight, due to inability to tolerate fluids and solids.  No apparent sick contacts at the facility. Other information is unable to be obtained as patient is level 5 caveat due to dementia   ED Course:  BP 127/67   Pulse 73   Temp (!) 97 F (36.1 C) (Rectal)   Resp (!) 24   Ht 5\' 1"  (1.549 m)   SpO2 95%   BMI 24.19 kg/m   Lactic acid was initially 2.76, now 2.58 White count 11, with unremarkable morphology in smear Glucose 219 Creatinine 0.9 Troponin 0 0.05 BNP 367.1 in view of oxygen demand Chest x-ray shows possible underlying fibrotic type change in the mid lower lung zones, without frank edema or consolidation.  Mildly enlarged heart.  No adenopathy. Last 2D echo in  2012 shows EF 55-60%, grade 1 diastolic, normal systolic Pulmonary evaluation was obtained, regarding aspiration.  They are recommending speech evaluation, before sending her back to the facility.  "There is no clear-cut reason for FOB at this time ". They also recommend Procalcitonin   Appreciate their involvement.   Review of Systems: Unable to obtain due to level 5 caveat due to dementia  Past Medical History:  Diagnosis Date  . Alzheimer disease   . Anemia   . Aortic insufficiency   . Depression   . HOH (hard of hearing)   . Hypertension   . Memory loss   . Mitral valve regurgitation   . Vitamin D deficiency     Past Surgical History:  Procedure Laterality Date  . CHOLECYSTECTOMY    . TONSILLECTOMY      Social History Social History   Socioeconomic History  . Marital status: Widowed    Spouse name: Not on file  . Number of children: 2  . Years of education: COLLEGE  . Highest education level: Not on file  Social Needs  . Financial resource strain: Not on file  . Food insecurity - worry: Not on file  . Food insecurity - inability: Not on file  . Transportation needs - medical: Not on file  . Transportation needs - non-medical: Not on file  Occupational History  . Occupation: RETIRED    Comment: HOMEMAKER  Tobacco Use  . Smoking status: Never Smoker  . Smokeless tobacco: Never  Used  Substance and Sexual Activity  . Alcohol use: No    Alcohol/week: 0.0 oz  . Drug use: No  . Sexual activity: No  Other Topics Concern  . Not on file  Social History Narrative   Pt is living at [facility] [place].       Allergies  Allergen Reactions  . Penicillins Rash    Has patient had a PCN reaction causing immediate rash, facial/tongue/throat swelling, SOB or lightheadedness with hypotension: Yes Has patient had a PCN reaction causing severe rash involving mucus membranes or skin necrosis: No Has patient had a PCN reaction that required hospitalization:  Unknown Has patient had a PCN reaction occurring within the last 10 years: No If all of the above answers are "NO", then may proceed with Cephalosporin use.  . Ativan [Lorazepam]     Agitation     Family History  Problem Relation Age of Onset  . Lung cancer Mother   . Dementia Sister   . Stroke Sister       Prior to Admission medications   Medication Sig Start Date End Date Taking? Authorizing Provider  aspirin 81 MG tablet Take 81 mg by mouth daily.    [provider]  donepezil (ARICEPT) 23 MG TABS tablet Take 1 tablet (23 mg total) by mouth at bedtime. 03/19/14   York Spaniel, MD  memantine (NAMENDA) 10 MG tablet Take 1 tablet (10 mg total) by mouth 2 (two) times daily. 03/19/14   York Spaniel, MD  sertraline (ZOLOFT) 100 MG tablet  03/11/16   [provider]    Physical Exam:  Vitals:   04/26/17 1232 04/26/17 1234 04/26/17 1400 04/26/17 1430  BP: 115/88  134/75 127/67  Pulse: 73     Resp: (!) 22  15 (!) 24  Temp: (!) 97 F (36.1 C)     TempSrc: Rectal     SpO2: 95%     Height:  5\' 1"  (1.549 m)     Constitutional: NAD, very agitated, disoriented ENMT: Mucous membranes are moist, without exudate or lesions  Neck: normal, supple, no masses, no thyromegaly Respiratory: Remarkable for minimal  wheezing, trace rales . Normal respiratory effort ;no stridor is noted Cardiovascular: regular  rate and rhythm,  murmur, rubs or gallops. No extremity edema. 2+ pedal pulses. No carotid bruits.  Abdomen: Soft, non tender, No hepatosplenomegaly. Bowel sounds positive.  Musculoskeletal: no clubbing / cyanosis. Moves all extremities Skin: no jaundice, No lesions.  Neurologic: Sensation intact  Strength equal in all extremities, cannot follow commands due to confusion. Wears hearing aid Psychiatric:   Awake, but agitated    Labs on Admission: I have personally reviewed following labs and imaging studies  CBC: Recent Labs  Lab 04/26/17 1216  WBC 11.0*   NEUTROABS 10.0*  HGB 14.4  HCT 46.1*  MCV 96.2  PLT 152    Basic Metabolic Panel: Recent Labs  Lab 04/26/17 1216  NA 142  K 3.5  CL 106  CO2 24  GLUCOSE 219*  BUN 13  CREATININE 0.90  CALCIUM 8.8*    GFR: CrCl cannot be calculated (Unknown ideal weight.).  Liver Function Tests: Recent Labs  Lab 04/26/17 1216  AST 38  ALT 23  ALKPHOS 95  BILITOT 0.8  PROT 6.9  ALBUMIN 3.3*   No results for input(s): LIPASE, AMYLASE in the last 168 hours. No results for input(s): AMMONIA in the last 168 hours.  Coagulation Profile: No results for input(s): INR, PROTIME in  the last 168 hours.  Cardiac Enzymes: No results for input(s): CKTOTAL, CKMB, CKMBINDEX, TROPONINI in the last 168 hours.  BNP (last 3 results) No results for input(s): PROBNP in the last 8760 hours.  HbA1C: No results for input(s): HGBA1C in the last 72 hours.  CBG: Recent Labs  Lab 04/26/17 1205  GLUCAP 212*    Lipid Profile: No results for input(s): CHOL, HDL, LDLCALC, TRIG, CHOLHDL, LDLDIRECT in the last 72 hours.  Thyroid Function Tests: No results for input(s): TSH, T4TOTAL, FREET4, T3FREE, THYROIDAB in the last 72 hours.  Anemia Panel: No results for input(s): VITAMINB12, FOLATE, FERRITIN, TIBC, IRON, RETICCTPCT in the last 72 hours.  Urine analysis:    Component Value Date/Time   COLORURINE YELLOW 12/30/2010 1425   APPEARANCEUR CLOUDY (A) 12/30/2010 1425   LABSPEC 1.018 12/30/2010 1425   PHURINE 5.5 12/30/2010 1425   GLUCOSEU NEGATIVE 12/30/2010 1425   HGBUR NEGATIVE 12/30/2010 1425   BILIRUBINUR NEGATIVE 12/30/2010 1425   KETONESUR NEGATIVE 12/30/2010 1425   PROTEINUR NEGATIVE 12/30/2010 1425   UROBILINOGEN 1.0 12/30/2010 1425   NITRITE NEGATIVE 12/30/2010 1425   LEUKOCYTESUR NEGATIVE 12/30/2010 1425    Sepsis Labs: @LABRCNTIP (procalcitonin:4,lacticidven:4) )No results found for this or any previous visit (from the past 240 hour(s)).   Radiological Exams on  Admission: Dg Chest 2 View  Result Date: 04/26/2017 CLINICAL DATA:  Shortness of breath.  Alzheimer's dementia EXAM: CHEST - 2 VIEW COMPARISON:  December 31, 2010 FINDINGS: There is interstitial thickening in the mid and lower lung zones. There is no edema or consolidation. The heart is mildly enlarged with pulmonary vascularity within normal limits. No adenopathy. There is aortic atherosclerosis. No bone lesions. IMPRESSION: Interstitial thickening in the mid lower lung zones, probably due to underlying fibrotic type change. No frank edema or consolidation. Heart mildly enlarged. There is aortic atherosclerosis. No evident adenopathy. Aortic Atherosclerosis (ICD10-I70.0). Electronically Signed   By: Bretta Bang III M.D.   On: 04/26/2017 13:16    EKG: Independently reviewed.  Assessment/Plan Principal Problem:   Aspiration into airway Active Problems:   Shortness of breath   Essential hypertension   TIA (transient ischemic attack)   Alzheimer's dementia   Memory deficit   Depression  Aspiration, in a patient with Alzheimer's dementia, now with swallowing difficulties.  The patient had a choking episode this morning, no apparent prior episodes similar to this one.  Currently, the patient is in room air.  Chest x-ray does not reveal obvious infiltrate WBC 11.  Admit to Medsurg Obs SLP to evaluate dysphagia. NPO for now CXR in am  Appreciate Pulmonary involvement who does not recommend FOB at this time  Lactic acid, and pro-calcitonin Duoneb and Albuterol for wheezing  Aspiration precautions    Dysphagia with aspiration as above SLP to determine dysphagia level and further plans regarding feeding issues   History of Alzheimer's dementia, on Aricept and Namenda .  These meds are to be held today  due to aspiration as above   Depression Zoloft on hold today due to aspiration risk   Elevated Glucose Patient noted to have elevated Glucose in labs at  212. No prior documented  history of DM Check CBG bid    DVT prophylaxis:  Lovenox Code Status:    DNR  Family Communication:  Discussed with daughter Disposition Plan: Expect patient to be discharged to home after condition improves Consults called:    Pulmonology Admission status: Medsurg Obs    Marlowe Kays, PA-C [facility]   Amion  text  [phone]   04/26/2017, 5:19 PM

## 2017-04-27 ENCOUNTER — Observation Stay (HOSPITAL_COMMUNITY): Payer: Medicare Other

## 2017-04-27 DIAGNOSIS — G301 Alzheimer's disease with late onset: Secondary | ICD-10-CM | POA: Diagnosis not present

## 2017-04-27 DIAGNOSIS — R413 Other amnesia: Secondary | ICD-10-CM

## 2017-04-27 DIAGNOSIS — Z515 Encounter for palliative care: Secondary | ICD-10-CM

## 2017-04-27 DIAGNOSIS — G459 Transient cerebral ischemic attack, unspecified: Secondary | ICD-10-CM | POA: Diagnosis not present

## 2017-04-27 DIAGNOSIS — R918 Other nonspecific abnormal finding of lung field: Secondary | ICD-10-CM | POA: Diagnosis not present

## 2017-04-27 DIAGNOSIS — I10 Essential (primary) hypertension: Secondary | ICD-10-CM | POA: Diagnosis not present

## 2017-04-27 DIAGNOSIS — T17908A Unspecified foreign body in respiratory tract, part unspecified causing other injury, initial encounter: Secondary | ICD-10-CM | POA: Diagnosis not present

## 2017-04-27 DIAGNOSIS — Z66 Do not resuscitate: Secondary | ICD-10-CM

## 2017-04-27 DIAGNOSIS — R531 Weakness: Secondary | ICD-10-CM | POA: Diagnosis not present

## 2017-04-27 DIAGNOSIS — F0281 Dementia in other diseases classified elsewhere with behavioral disturbance: Secondary | ICD-10-CM

## 2017-04-27 DIAGNOSIS — R739 Hyperglycemia, unspecified: Secondary | ICD-10-CM

## 2017-04-27 DIAGNOSIS — R0602 Shortness of breath: Secondary | ICD-10-CM

## 2017-04-27 MED ORDER — IPRATROPIUM-ALBUTEROL 0.5-2.5 (3) MG/3ML IN SOLN
3.0000 mL | Freq: Three times a day (TID) | RESPIRATORY_TRACT | Status: DC
  Filled 2017-04-27: qty 3

## 2017-04-27 NOTE — Clinical Social Work Note (Signed)
Clinical Social Work Assessment  Patient Details  Name: Michelle Savage MRN: [id number] Date of Birth: 06/29/1924  Date of referral:  04/27/17               Reason for consult:  Facility Placement                Permission sought to share information with:  Facility Sport and exercise psychologist, Family Supports Permission granted to share information::  Yes, Verbal Permission Granted  Name::     Loraine Grip  Agency::  [employer]  Relationship::  Daughters  Sport and exercise psychologist Information:     Housing/Transportation Living arrangements for the past 2 months:  [place] of Information:  Adult Children Patient Interpreter Needed:  None Criminal Activity/Legal Involvement Pertinent to Current Situation/Hospitalization:  No - Comment as needed Significant Relationships:  Adult Children Lives with:  Self, Facility Resident Do you feel safe going back to the place where you live?  Yes Need for family participation in patient care:  Yes (Comment)(patient has dementia)  Care giving concerns:  Patient has been at [facility] in the memory care unit and family has no concerns about care received.   Social Worker assessment / plan:  CSW met with patient's daughter at bedside to discuss plan to return to [facility]. CSW contacted clinical liaison for [facility] and sent updated information to facility. CSW updated patient's daughters that the facility was ready for her to return.  Employment status:  Retired Nurse, adult PT Recommendations:  Not assessed at this time Information / Referral to community resources:     Patient/Family's Response to care:  Patient's family agreeable for patient to return to her memory care unit.  Patient/Family's Understanding of and Emotional Response to Diagnosis, Current Treatment, and Prognosis:  Patient's daughter discussed how she was surprised that the patient would be going back so soon, but pleased  that the medical workup showed no serious findings. Patient's daughters will provide transport after clothes are picked up for her.  Emotional Assessment Appearance:  Appears stated age Attitude/Demeanor/Rapport:  Unable to Assess Affect (typically observed):  Unable to Assess Orientation:  Oriented to Self Alcohol / Substance use:  Not Applicable Psych involvement (Current and /or in the community):  No (Comment)  Discharge Needs  Concerns to be addressed:  Care Coordination Readmission within the last 30 days:  No Current discharge risk:  Physical Impairment, Cognitively Impaired Barriers to Discharge:  No Barriers Identified   Geralynn Ochs, LCSW 04/27/2017, 2:52 PM

## 2017-04-27 NOTE — Progress Notes (Signed)
Discharge to: [facility] Anticipated dischargeBarrett Henle date: 04/27/17 Family notified: Yes, at bedside Transportation by: Family car  Report #: [phone]  CSW signing off.  Blenda Nicelylizabeth Jerri Glauser LCSW [phone]

## 2017-04-27 NOTE — Care Management Note (Signed)
Case Management Note  Patient Details  Name: Michelle Savage MRN: [id number] Date of Birth: 02/21/1924  Subjective/Objective:    Pt in with aspiration into airway. [facility]                Action/Plan: Plan is for patient to return to [facility] when medically ready. CSW aware. CM following.  Expected Discharge Date:                  Expected Discharge Plan:  Skilled Nursing Facility  In-House Referral:  Clinical Social Work  Discharge planning Services     Post Acute Care Choice:    Choice offered to:     DME Arranged:    DME Agency:     HH Arranged:    HH Agency:     Status of Service:  In process, will continue to follow  If discussed at Long Length of Stay Meetings, dates discussed:    Additional Comments:  Kermit BaloKelli F Airen Dales, RN 04/27/2017, 12:47 PM

## 2017-04-27 NOTE — NC FL2 (Signed)
[place] MEDICAID FL2 LEVEL OF CARE SCREENING TOOL     IDENTIFICATION  Patient Name: Michelle Savage Birthdate: 07/07/1924 Sex: female Admission Date (Current Location): 04/26/2017  [facility] [place]   Facility and Address:  [facility], [address], [place], [place] [address]      Provider Number: [id number]  Attending Physician Name and Address:  Darlin DropHall, Carole N, DO  Relative Name and Phone Number:       Current Level of Care: Hospital Recommended Level of Care: Memory Care Prior Approval Number:    Date Approved/Denied:   PASRR Number:    Discharge Plan: Other (Comment)(Memory Care)    Current Diagnoses: Patient Active Problem List   Diagnosis Date Noted  . Depression 04/26/2017  . Shortness of breath 04/26/2017  . Aspiration into airway 04/26/2017  . Memory deficit 10/08/2012  . TIA (transient ischemic attack) 12/30/2010  . Alzheimer's dementia 12/30/2010  . Hyperglycemia 12/30/2010  . Essential hypertension 01/15/2009    Orientation RESPIRATION BLADDER Height & Weight     Self  O2, Other (Comment)([place] 2L) Continent Weight: 123 lb 10.9 oz (56.1 kg) Height:  5\' 1"  (154.9 cm)  BEHAVIORAL SYMPTOMS/MOOD NEUROLOGICAL BOWEL NUTRITION STATUS      Continent Diet(regular)  AMBULATORY STATUS COMMUNICATION OF NEEDS Skin   Limited Assist Verbally Normal                       Personal Care Assistance Level of Assistance  Bathing, Feeding, Dressing Bathing Assistance: Limited assistance Feeding assistance: Limited assistance Dressing Assistance: Limited assistance     Functional Limitations Info  Sight, Hearing, Speech Sight Info: Adequate Hearing Info: Adequate Speech Info: Adequate    SPECIAL CARE FACTORS FREQUENCY                       Contractures Contractures Info: Not present    Additional Factors Info  Code Status, Allergies Code Status Info: DNR Allergies Info: Penicillins, Ativan  Lorazepam           Current Medications (04/27/2017):  This is the current hospital active medication list Current Facility-Administered Medications  Medication Dose Route Frequency Provider Last Rate Last Dose  . 0.9 %  sodium chloride infusion   Intravenous Continuous Jonah BlueYates, Jennifer, MD 50 mL/hr at 04/26/17 1740    . acetaminophen (TYLENOL) tablet 650 mg  650 mg Oral Q6H PRN Marcos EkeWertman, Sara E, PA-C       Or  . acetaminophen (TYLENOL) suppository 650 mg  650 mg Rectal Q6H PRN Marcos EkeWertman, Sara E, PA-C      . albuterol (PROVENTIL) (2.5 MG/3ML) 0.083% nebulizer solution 2.5 mg  2.5 mg Nebulization Q2H PRN Gwynneth MunsonWertman, Sara E, PA-C      . bisacodyl (DULCOLAX) suppository 10 mg  10 mg Rectal Daily PRN Marcos EkeWertman, Sara E, PA-C      . enoxaparin (LOVENOX) injection 40 mg  40 mg Subcutaneous Q24H Wertman, Sara E, PA-C      . haloperidol lactate (HALDOL) injection 0.5 mg  0.5 mg Intravenous Q6H PRN Leda GauzeKirby-Graham, Karen J, NP   0.5 mg at 04/27/17 0010  . ipratropium-albuterol (DUONEB) 0.5-2.5 (3) MG/3ML nebulizer solution 3 mL  3 mL Nebulization TID Lupita RaiderShaw, Kimberlee, MD      . ondansetron [facility](ZOFRAN) tablet 4 mg  4 mg Oral Q6H PRN Marlowe KaysWertman, Sara E, PA-C       Or  . ondansetron (ZOFRAN) injection 4 mg  4  mg Intravenous Q6H PRN Marcos Eke, PA-C         Discharge Medications: Please see discharge summary for a list of discharge medications.  Relevant Imaging Results:  Relevant Lab Results:   Additional Information SS#: [id number]  Baldemar Lenis, LCSW

## 2017-04-27 NOTE — Evaluation (Signed)
Clinical/Bedside Swallow Evaluation Patient Details  Name: Michelle Savage MRN: [id number] Date of Birth: Jun 07, 1924  Today's Date: 04/27/2017 Time: SLP Start Time (ACUTE ONLY): 1010 SLP Stop Time (ACUTE ONLY): 1030 SLP Time Calculation (min) (ACUTE ONLY): 20 min  Past Medical History:  Past Medical History:  Diagnosis Date  . Alzheimer disease   . Anemia   . Aortic insufficiency   . Depression   . HOH (hard of hearing)   . Hypertension   . Memory loss   . Mitral valve regurgitation   . Vitamin D deficiency    Past Surgical History:  Past Surgical History:  Procedure Laterality Date  . CHOLECYSTECTOMY    . TONSILLECTOMY     HPI:  Michelle Savage is a 84 y.o. female with medical history significant for HTN,remote TIA, severe dementia, brought to there ED for evaluation of shortness of breath. In review, patient had just finished eating breakfast, when she developed sudden onset of difficulty breathing.  Suspecting that the patient had choked, EMS was requested.  On presentation, she was gurgling and coughing of fluids.  Nursing staff denied hemoptysis, but reported that the patient had to be placed on CPAP.  She almost had to be evaluated due to difficulty breathing.  He received DuoNeb breathing treatment x2, as well as IV Solu-Medrol EMS.  Given the frothy coughing, EMS was worried about pulmonary edema.  Per chart report, there were no apparent fevers, chills, cough, congestion or other constitutional symptoms.  On presentation, the patient was in room air, with O2 sats now normalized at 95%.  However, she continues to be very agitated, disoriented, and is being admitted for observation. Daughter reports that the patient has lost significant amount of weight, due to inability to tolerate fluids and solids.  No apparent sick contacts at the facility. Other information is unable to be obtained as patient is level 5 caveat due to dementia    Assessment / Plan /  Recommendation Clinical Impression  Pt is at reduced risk of aspiration when following general aspiration precautions. Pt consumed thin liquids via straw and puree as well as solids without any overt s/s of aspiration or orpharyngeal dysphagia. Pt is pleasant but very confused. Would recommend returning pt to regular diet with thin liquids. Education provided to daughter that pt's with progressive neurological diseases are at a higher risk of aspiration. Daughter is concerned about pt returning to facility today and would like for her to stay another night d/t lack of nurses at [facility] (not fear of her mother choking). Education provided that ST would be signing off of case d/t no indication that skilled services were indicated. All questions answered to daughter's satisfaction.  SLP Visit Diagnosis: Dysphagia, unspecified (R13.10)    Aspiration Risk  Mild aspiration risk;No limitations    Diet Recommendation Regular;Thin liquid   Liquid Administration via: Cup;Straw Medication Administration: Whole meds with liquid Supervision: Patient able to self feed Compensations: Minimize environmental distractions;Slow rate;Small sips/bites Postural Changes: Seated upright at 90 degrees    Other  Recommendations Oral Care Recommendations: Oral care BID   Follow up Recommendations None      Frequency and Duration            Prognosis Barriers to Reach Goals: Cognitive deficits      Swallow Study   General Date of Onset: 04/26/17 HPI: Michelle Savage is a 84 y.o. female with medical history significant for HTN,remote TIA, severe dementia, brought to there ED for evaluation of  shortness of breath. In review, patient had just finished eating breakfast, when she developed sudden onset of difficulty breathing.  Suspecting that the patient had choked, EMS was requested.  On presentation, she was gurgling and coughing of fluids.  Nursing staff denied hemoptysis, but reported that the  patient had to be placed on CPAP.  She almost had to be evaluated due to difficulty breathing.  He received DuoNeb breathing treatment x2, as well as IV Solu-Medrol EMS.  Given the frothy coughing, EMS was worried about pulmonary edema.  Per chart report, there were no apparent fevers, chills, cough, congestion or other constitutional symptoms.  On presentation, the patient was in room air, with O2 sats now normalized at 95%.  However, she continues to be very agitated, disoriented, and is being admitted for observation. Daughter reports that the patient has lost significant amount of weight, due to inability to tolerate fluids and solids.  No apparent sick contacts at the facility. Other information is unable to be obtained as patient is level 5 caveat due to dementia  Type of Study: Bedside Swallow Evaluation Previous Swallow Assessment: none in chart Diet Prior to this Study: NPO Temperature Spikes Noted: No Respiratory Status: Nasal cannula History of Recent Intubation: No Behavior/Cognition: Alert;Confused Oral Cavity Assessment: Within Functional Limits Oral Care Completed by SLP: No Oral Cavity - Dentition: Adequate natural dentition Vision: Functional for self-feeding Self-Feeding Abilities: Able to feed self;Needs assist Patient Positioning: Upright in bed Baseline Vocal Quality: Normal Volitional Cough: Strong Volitional Swallow: Able to elicit    Oral/Motor/Sensory Function Overall Oral Motor/Sensory Function: Within functional limits   Ice Chips Ice chips: Not tested   Thin Liquid Thin Liquid: Within functional limits Presentation: Self Fed;Straw    Nectar Thick Nectar Thick Liquid: Not tested   Honey Thick Honey Thick Liquid: Not tested   Puree Puree: Within functional limits Presentation: Spoon   Solid   GO   Solid: Within functional limits    Functional Assessment Tool Used: Skilled clinical observations Functional Limitations: Swallowing Swallow Current Status  ([id number](G8996): 0 percent impaired, limited or restricted Swallow Goal Status ([id number](G8997): 0 percent impaired, limited or restricted Swallow Discharge Status [phone]([id number]): 0 percent impaired, limited or restricted   Michelle Savage 04/27/2017,10:46 AM

## 2017-04-27 NOTE — Consult Note (Signed)
Consultation Note Date: 04/27/2017   Patient Name: Michelle Savage  DOB: [birth year]  MRN: [id number]  Age / Sex: 84 y.o., female  PCP: [name], MD Referring Physician: Darlin Drop, DO  Reason for Consultation: Establishing goals of care and Psychosocial/spiritual support  HPI/Patient Profile: 84 y.o. female  Admitted on  04/26/2017 with a past medical history significant for HTN,remote TIA, severe dementia, brought to there ED for evaluation of shortness of breath.   In review, patient had just finished eating breakfast, when she developed sudden onset of difficulty breathing.  Suspecting that the patient had choked, EMS was requested.   She lives at [facility] assisted living   On presentation, she was gurgling and coughing of fluids.  Nursing staff denied hemoptysis, but reported that the patient had to be placed on CPAP.    Family report continued physical, functional, and cognitive decline over the last year.  Currently patient is lethargic, she did receive Haldol through the night for combativeness, however she appears comfortable at this time.  Family face treatment option decisions advance directive decisions and anticipatory care needs.    Clinical Assessment and Goals of Care:  This NP Lorinda Creed reviewed medical records, received report from team, assessed the patient and then meet at the patient's bedside along with her daughter Glendon Axe to discuss diagnosis, prognosis, GOC, EOL wishes disposition and options.  Concept of Hospice and Palliative Care were discussed  A detailed discussion was had today regarding advanced directives.  Concepts specific to code status, artifical feeding and hydration, continued IV antibiotics and rehospitalization was had.  The difference between a aggressive medical intervention path  and a palliative comfort care path for this patient at this  time was had.  Values and goals of care important to patient and family were attempted to be elicited.  MOST form introduced.  Had a long discussion regarding the natural trajectory of dementia within the context of failure to thrive.   Hard choices booklet for review.  Natural trajectory and expectations at EOL were discussed.  Questions and concerns addressed.   Family encouraged to call with questions or concerns.    PMT will continue to support holistically.   HCPOA/daughters    SUMMARY OF RECOMMENDATIONS    Code Status/Advance Care Planning:  DNR   Palliative Prophylaxis:   Aspiration, Delirium Protocol, Frequent Pain Assessment and Oral Care  Additional Recommendations (Limitations, Scope, Preferences):  Family currently desire  treat the treatable and hope for improvement.     Psycho-social/Spiritual:   Desire for further Chaplaincy support:no  Additional Recommendations: Education on Hospice  Prognosis:   Unable to determine  Discharge Planning: Hopefully patient will improve some and be able to return to her previous living situation at [facility] and we will be able to augment with hospice services, at a minimum have palliative community services follow at facility.  To Be Determined      Primary Diagnoses: Present on Admission: . Essential hypertension . TIA (transient ischemic attack) . Memory deficit .  Shortness of breath . Hyperglycemia   I have reviewed the medical record, interviewed the patient and family, and examined the patient. The following aspects are pertinent.  Past Medical History:  Diagnosis Date  . Alzheimer disease   . Anemia   . Aortic insufficiency   . Depression   . HOH (hard of hearing)   . Hypertension   . Memory loss   . Mitral valve regurgitation   . Vitamin D deficiency    Social History   Socioeconomic History  . Marital status: Widowed    Spouse name: None  . Number of children: 2  . Years of  education: COLLEGE  . Highest education level: None  Social Needs  . Financial resource strain: None  . Food insecurity - worry: None  . Food insecurity - inability: None  . Transportation needs - medical: None  . Transportation needs - non-medical: None  Occupational History  . Occupation: RETIRED    Comment: HOMEMAKER  Tobacco Use  . Smoking status: Never Smoker  . Smokeless tobacco: Never Used  Substance and Sexual Activity  . Alcohol use: No    Alcohol/week: 0.0 oz  . Drug use: No  . Sexual activity: No  Other Topics Concern  . None  Social History Narrative   [facility] at [facility] [place].     Family History  Problem Relation Age of Onset  . Lung cancer Mother   . Dementia Sister   . Stroke Sister    Scheduled Meds: . enoxaparin (LOVENOX) injection  40 mg Subcutaneous Q24H  . ipratropium-albuterol  3 mL Nebulization QID   Continuous Infusions: . sodium chloride 50 mL/hr at 04/26/17 1740   PRN Meds:.acetaminophen **OR** acetaminophen, albuterol, bisacodyl, haloperidol lactate, ondansetron **OR** ondansetron (ZOFRAN) IV Medications Prior to Admission:  Prior to Admission medications   Medication Sig Start Date End Date Taking? Authorizing Provider  donepezil (ARICEPT) 23 MG TABS tablet Take 1 tablet (23 mg total) by mouth at bedtime. 03/19/14  Yes York SpanielWillis, Charles K, MD  ENSURE (ENSURE) Take 237 mLs by mouth 2 (two) times daily as needed (meal supplement). Chocolate   Yes [provider]  ibuprofen (ADVIL,MOTRIN) 200 MG tablet Take 200 mg by mouth every 4 (four) hours as needed (pain/discomfort).   Yes [provider]  loperamide (IMODIUM A-D) 2 MG tablet Take 2 mg by mouth every 6 (six) hours as needed for diarrhea or loose stools.   Yes [provider]  LORazepam (ATIVAN) 0.5 MG tablet Take 0.25 mg by mouth 2 (two) times daily as needed for anxiety.   Yes [provider]  memantine (NAMENDA) 10 MG tablet Take 1 tablet (10  mg total) by mouth 2 (two) times daily. 03/19/14  Yes York SpanielWillis, Charles K, MD  sertraline (ZOLOFT) 100 MG tablet Take 100 mg by mouth daily.  03/11/16  Yes [provider]   Allergies  Allergen Reactions  . Penicillins Rash    Has patient had a PCN reaction causing immediate rash, facial/tongue/throat swelling, SOB or lightheadedness with hypotension: Yes Has patient had a PCN reaction causing severe rash involving mucus membranes or skin necrosis: No Has patient had a PCN reaction that required hospitalization: Unknown Has patient had a PCN reaction occurring within the last 10 years: No If all of the above answers are "NO", then may proceed with Cephalosporin use.  . Ativan [Lorazepam]     Agitation    Review of Systems  Unable to perform ROS   Physical  Exam  Constitutional: She appears lethargic. She appears cachectic. She appears ill. Nasal cannula in place.  Neurological: She appears lethargic.    Vital Signs: BP (!) 117/58 (BP Location: Right Arm)   Pulse 75   Temp 98 F (36.7 C) (Oral)   Resp 20   Ht 5\' 1"  (1.549 m)   Wt 56.1 kg (123 lb 10.9 oz)   SpO2 98%   BMI 23.37 kg/m  Pain Assessment: 0-10   Pain Score: Asleep   SpO2: SpO2: 98 % O2 Device:SpO2: 98 % O2 Flow Rate: .O2 Flow Rate (L/min): 2 L/min  IO: Intake/output summary:   Intake/Output Summary (Last 24 hours) at 04/27/2017 0938 Last data filed at 04/27/2017 0300 Gross per 24 hour  Intake 500 ml  Output -  Net 500 ml    LBM:   Baseline Weight: Weight: 52.7 kg (116 lb 2.9 oz) Most recent weight: Weight: 56.1 kg (123 lb 10.9 oz)      Palliative Assessment/Data: 40%     Time In: 0830 Time Out: 0945 Time Total: 75 minutes Greater than 50%  of this time was spent counseling and coordinating care related to the above assessment and plan.  Signed by: Lorinda Creed, NP   Please contact Palliative Medicine Team phone at [phone] for questions and concerns.  For individual provider: See  Loretha Stapler

## 2017-04-27 NOTE — Discharge Summary (Addendum)
Discharge Summary  Michelle Savage ZOX:[id number] DOB: 02/20/1924  PCP: Lupita RaiderShaw, Kimberlee, MD  Admit date: 04/26/2017 Discharge date: 04/27/2017  Time spent: 25 minutes \  Recommendations for Outpatient Follow-up:  1. Follow-up with PCP 2. Take your medications as prescribed 3. Fall precaution   Discharge Diagnoses:  Active Hospital Problems   Diagnosis Date Noted  . Aspiration into airway 04/26/2017  . Depression 04/26/2017  . Shortness of breath 04/26/2017  . Memory deficit 10/08/2012  . TIA (transient ischemic attack) 12/30/2010  . Alzheimer's dementia 12/30/2010  . Hyperglycemia 12/30/2010  . Essential hypertension 01/15/2009    Resolved Hospital Problems  No resolved problems to display.    Discharge Condition: Stable  Diet recommendation: Resume previous diet  Vitals:   04/27/17 0931 04/27/17 1252  BP:  109/83  Pulse:  68  Resp:  20  Temp:  98.1 F (36.7 C)  SpO2: 98% 99%    History of present illness:  Michelle Savage is a 84 y.o. female with medical history significant for HTN,remote TIA, severe dementia, brought to there ED for evaluation of shortness of breath. In review, patient had just finished eating breakfast, when she developed sudden onset of difficulty breathing.  Suspecting that the patient had choked, EMS was requested.  On presentation, she was gurgling and coughing of fluids.  Nursing staff denied hemoptysis, but reported that the patient had to be placed on CPAP.  She almost had to be evaluated due to difficulty breathing. She received DuoNeb breathing treatment x2, as well as IV Solu-Medrol EMS.  Given the frothy coughing, EMS was worried about pulmonary edema.  Per chart report, there were no apparent fevers, chills, cough, congestion or other constitutional symptoms.  On presentation, the patient was in room air, with O2 sats now normalized at 95%.    04/27/2017: On the day of discharge patient was hemodynamically stable.  Speech therapy  evaluation was done and unremarkable.  Patient was recommended to resume regular consistency diet and thin liquids.  Vital signs, labs, chest x-ray reviewed.  Afebrile no leukocytosis no signs of SIRS. It appears that the patient may have had pneumonitis, no sign of pneumonia or infective process at this time.   Hospital Course:  Principal Problem:   Aspiration into airway Active Problems:   Essential hypertension   TIA (transient ischemic attack)   Alzheimer's dementia   Hyperglycemia   Memory deficit   Depression   Shortness of breath  Suspected aspiration, in a patient with Alzheimer's dementia Aspiration precautions   Physical exam unremarkable Afebrile no leukocytosis Chest x-ray reviewed with mild infiltrates versus atelectasis at the right middle lobe indicative of pneumonitis from possible aspiration. Speech therapist evaluated and recommended resuming regular consistency diet and thin liquid  History of Alzheimer's dementia, on Aricept and Namenda  -Reorient as needed   Depression Continue Zoloft   Procedures:  Swallow evaluation  Consultations:  Speech therapy  Discharge Exam: BP 109/83 (BP Location: Right Arm)   Pulse 68   Temp 98.1 F (36.7 C) (Oral)   Resp 20   Ht 5\' 1"  (1.549 m)   Wt 56.1 kg (123 lb 10.9 oz)   SpO2 99%   BMI 23.37 kg/m   General: 84 year old Caucasian female well-developed well-nourished in no acute distress.  Underlying advanced dementia. Cardiovascular: Regular rate and rhythm with no rubs or gallops Respiratory: Clear to auscultation with no wheezes or rales  Discharge Instructions You were cared for by a hospitalist during your hospital stay. If you  have any questions about your discharge medications or the care you received while you were in the hospital after you are discharged, you can call the unit and asked to speak with the hospitalist on call if the hospitalist that took care of you is not available. Once you are  discharged, your primary care physician will handle any further medical issues. Please note that NO REFILLS for any discharge medications will be authorized once you are discharged, as it is imperative that you return to your primary care physician (or establish a relationship with a primary care physician if you do not have one) for your aftercare needs so that they can reassess your need for medications and monitor your lab values.   Allergies as of 04/27/2017      Reactions   Penicillins Rash   Has patient had a PCN reaction causing immediate rash, facial/tongue/throat swelling, SOB or lightheadedness with hypotension: Yes Has patient had a PCN reaction causing severe rash involving mucus membranes or skin necrosis: No Has patient had a PCN reaction that required hospitalization: Unknown Has patient had a PCN reaction occurring within the last 10 years: No If all of the above answers are "NO", then may proceed with Cephalosporin use.   Ativan [lorazepam]    Agitation       Medication List    STOP taking these medications   LORazepam 0.5 MG tablet Commonly known as:  ATIVAN     TAKE these medications   donepezil 23 MG Tabs tablet Commonly known as:  ARICEPT Take 1 tablet (23 mg total) by mouth at bedtime.   ENSURE Take 237 mLs by mouth 2 (two) times daily as needed (meal supplement). Chocolate   ibuprofen 200 MG tablet Commonly known as:  ADVIL,MOTRIN Take 200 mg by mouth every 4 (four) hours as needed (pain/discomfort).   loperamide 2 MG tablet Commonly known as:  IMODIUM A-D Take 2 mg by mouth every 6 (six) hours as needed for diarrhea or loose stools.   memantine 10 MG tablet Commonly known as:  NAMENDA Take 1 tablet (10 mg total) by mouth 2 (two) times daily.   sertraline 100 MG tablet Commonly known as:  ZOLOFT Take 100 mg by mouth daily.      Allergies  Allergen Reactions  . Penicillins Rash    Has patient had a PCN reaction causing immediate rash,  facial/tongue/throat swelling, SOB or lightheadedness with hypotension: Yes Has patient had a PCN reaction causing severe rash involving mucus membranes or skin necrosis: No Has patient had a PCN reaction that required hospitalization: Unknown Has patient had a PCN reaction occurring within the last 10 years: No If all of the above answers are "NO", then may proceed with Cephalosporin use.  Michelle Savage [Lorazepam]     Agitation    Follow-up Information    [name], MD Follow up.   Specialty:  Family Medicine Contact information: [address] [place] [address] [phone]            The results of significant diagnostics from this hospitalization (including imaging, microbiology, ancillary and laboratory) are listed below for reference.    Significant Diagnostic Studies: Dg Chest 2 View  Result Date: 04/26/2017 CLINICAL DATA:  Shortness of breath.  Alzheimer's dementia EXAM: CHEST - 2 VIEW COMPARISON:  December 31, 2010 FINDINGS: There is interstitial thickening in the mid and lower lung zones. There is no edema or consolidation. The heart is mildly enlarged with pulmonary vascularity within normal  limits. No adenopathy. There is aortic atherosclerosis. No bone lesions. IMPRESSION: Interstitial thickening in the mid lower lung zones, probably due to underlying fibrotic type change. No frank edema or consolidation. Heart mildly enlarged. There is aortic atherosclerosis. No evident adenopathy. Aortic Atherosclerosis (ICD10-I70.0). Electronically Signed   By: Bretta Bang III M.D.   On: 04/26/2017 13:16   Dg Chest Port 1 View  Result Date: 04/27/2017 CLINICAL DATA:  Aspiration. EXAM: PORTABLE CHEST 1 VIEW COMPARISON:  04/26/2017 FINDINGS: The heart is enlarged but stable. Stable tortuosity of the thoracic aorta. Persistent bibasilar opacities but slight improved basilar aeration. No pleural effusions or pulmonary edema. IMPRESSION: Persistent but slightly  improved bibasilar lung opacities. Electronically Signed   By: Rudie Meyer M.D.   On: 04/27/2017 09:26    Microbiology: No results found for this or any previous visit (from the past 240 hour(s)).   Labs: Basic Metabolic Panel: Recent Labs  Lab 04/26/17 1216  NA 142  K 3.5  CL 106  CO2 24  GLUCOSE 219*  BUN 13  CREATININE 0.90  CALCIUM 8.8*   Liver Function Tests: Recent Labs  Lab 04/26/17 1216  AST 38  ALT 23  ALKPHOS 95  BILITOT 0.8  PROT 6.9  ALBUMIN 3.3*   No results for input(s): LIPASE, AMYLASE in the last 168 hours. No results for input(s): AMMONIA in the last 168 hours. CBC: Recent Labs  Lab 04/26/17 1216  WBC 11.0*  NEUTROABS 10.0*  HGB 14.4  HCT 46.1*  MCV 96.2  PLT 152   Cardiac Enzymes: No results for input(s): CKTOTAL, CKMB, CKMBINDEX, TROPONINI in the last 168 hours. BNP: BNP (last 3 results) Recent Labs    04/26/17 1217  BNP 367.1*    ProBNP (last 3 results) No results for input(s): PROBNP in the last 8760 hours.  CBG: Recent Labs  Lab 04/26/17 1205  GLUCAP 212*       Signed:  Darlin Drop, MD [facility] 04/27/2017, 1:54 PM

## 2017-04-27 NOTE — Discharge Instructions (Addendum)
Aspiration Precautions, Adult Aspiration is the breathing in (inhalation) of a liquid or object into the lungs. Things that can be inhaled into the lungs include:  Food.  Any type of liquid, such as drinks or saliva.  Stomach contents, such as vomit or stomach acid.  What are the signs of aspiration? Signs of aspiration include:  Coughing after swallowing food or liquids.  Clearing the throat often while eating.  Trouble breathing. This may include: ? Breathing quickly. ? Breathing very slowly. ? Loud breathing. ? Rumbling sounds from the lungs while breathing.  Coughing up phlegm (sputum) that: ? Is yellow, tan, or green. ? Has pieces of food in it. ? Is bad-smelling.  Having a hoarse, barky cough.  Not being able to speak.  A hoarse voice.  Drooling while eating.  A feeling of fullness in the throat or a feeling that something is stuck in the throat.  Choking often.  Having a runny noise while eating.  Coughing when lying down or having to sit up quickly after lying down.  A change in skin color. The skin may look red or blue.  Fever.  Watery eyes.  Pain in the chest or back.  A pained look on the face.  What are the complications of aspiration? Complications of aspiration include:  Losing weight because the person is not absorbing needed nutrients.  Loss of enjoyment and the social benefits of eating.  Choking.  Lung irritation, if someone aspirates acidic food or drinks.  Lung infection (pneumonia).  Collection of infected liquid (pus) in the lungs (lung abscess).  In serious cases, death can occur. What can I do to prevent aspiration? Caring for someone who has a feeding tube If you are caring for someone who has a feeding tube who cannot eat or drink safely through his or her mouth:  Keep the person in an upright position as much as possible.  Do not lay the person flat if he or she is getting continuous feedings. If you need to lay the  person flat for any reason, turn the feeding pump off.  Check feeding tube residuals as told by your health care provider. Ask your health care provider what residual amount is too high.  Caring for someone who can eat and drink safely by mouth If you are caring for someone who can eat and drink safely through his or her mouth:  Have the person sit in an upright position when eating food or drinking fluids. This can be done in two ways: ? Have the person sit up in a chair. ? If sitting in a chair is not possible, position the person in bed so he or she is upright.  Remind the person to eat slowly and chew well. Make sure the person is awake and alert while eating.  Do not distract the person. This is especially important for people with thinking or memory (cognitive) problems.  Allow foods to cool. Hot foods may be more difficult to swallow.  Provide small meals more frequently, instead of 3 large meals. This may reduce fatigue during eating.  Check the person's mouth thoroughly for leftover food after eating.  Keep the person sitting upright for 30-45 minutes after eating.  Do not serve food or drink during 2 hours or more before bedtime.  General instructions Follow these general guidelines to prevent aspiration in someone who can eat and drink safely by mouth:  Never put food or liquids in the mouth of a person who is   not fully alert.  Feed small amounts of food. Do not force feed.  For a person who is on a diet for swallowing difficulty (dysphagia diet), follow the recommended food and drink consistency. For example, in dysphagia diet level 1, thicken liquids to pudding-like consistency.  Use as little water as possible when brushing the person's teeth or cleaning his or her mouth.  Provide oral care before and after meals.  Use adaptive devices such as cut-out cups, straws, or utensils as told by the health care provider.  Crush pills and put them in soft food such as  pudding or ice cream. Some pills should not be crushed. Check with the health care provider before crushing any medicine.  Contact a health care provider if:  The person has a feeding tube, and the feeding tube residual amount is too high.  The person has a fever.  The person tries to avoid food or water, such as refusing to eat, drink, or be fed, or is eating less than normal.  The person may have aspirated food or liquid.  You notice warning signs, such as choking or coughing, when the person eats or drinks. Get help right away if:  The person has trouble breathing or starts to breathe quickly.  The person is breathing very slowly or stops breathing.  The person coughs a lot after eating or drinking.  The person has a long-lasting (chronic) cough.  The person coughs up thick, yellow, or tan sputum.  If someone is choking on food or an object, perform the Heimlich maneuver (abdominal thrusts).  The person has symptoms of pneumonia, such as: ? Coughing a lot. ? Coughing up mucus with a bad smell or blood in it. ? Feeling short of breath. ? Complaining of chest pain. ? Sweating, fever, and chills. ? Feeling tired. ? Complaining of trouble breathing. ? Wheezing.  The person cannot stop choking.  The person is unable to breathe, turns blue, faints, or seems confused. These symptoms may represent a serious problem that is an emergency. Do not wait to see if the symptoms will go away. Get medical help right away. Call your local emergency services (911 in the [place]  Summary  Aspiration is the breathing in (inhalation) of a liquid or object into the lungs. Things that can be inhaled into the lungs include food, liquids, saliva, or stomach contents.  Aspiration can cause pneumonia or choking.  One sign of aspiration is coughing after swallowing food or liquids.  Contact a health care provider if you notice signs of aspiration. This information is not intended to replace  advice given to you by your health care provider. Make sure you discuss any questions you have with your health care provider. Document Released: 03/07/2010 Document Revised: 10/31/2015 Document Reviewed: 10/31/2015 Elsevier Interactive Patient Education  2018 [employer]  

## 2017-04-27 NOTE — Progress Notes (Signed)
Discharge  Report was called to [facility] by this RN.  Discharge instructions were reviewed with pt and her daughter at discharge. PIV was removed and pt has no complaints of pain during my care. Daughters state they will dress pt and provide transportation at discharge.

## 2017-04-29 DIAGNOSIS — R531 Weakness: Secondary | ICD-10-CM

## 2017-04-29 DIAGNOSIS — Z515 Encounter for palliative care: Secondary | ICD-10-CM

## 2017-04-29 DIAGNOSIS — Z66 Do not resuscitate: Secondary | ICD-10-CM

## 2017-05-05 DIAGNOSIS — E46 Unspecified protein-calorie malnutrition: Secondary | ICD-10-CM | POA: Diagnosis not present

## 2017-05-05 DIAGNOSIS — G309 Alzheimer's disease, unspecified: Secondary | ICD-10-CM | POA: Diagnosis not present

## 2017-05-05 DIAGNOSIS — J690 Pneumonitis due to inhalation of food and vomit: Secondary | ICD-10-CM | POA: Diagnosis not present

## 2017-05-26 DIAGNOSIS — R1311 Dysphagia, oral phase: Secondary | ICD-10-CM | POA: Diagnosis not present

## 2017-06-01 DIAGNOSIS — R1311 Dysphagia, oral phase: Secondary | ICD-10-CM | POA: Diagnosis not present

## 2017-06-02 DIAGNOSIS — R1311 Dysphagia, oral phase: Secondary | ICD-10-CM | POA: Diagnosis not present

## 2017-06-08 DIAGNOSIS — R1311 Dysphagia, oral phase: Secondary | ICD-10-CM | POA: Diagnosis not present

## 2017-06-09 DIAGNOSIS — R1311 Dysphagia, oral phase: Secondary | ICD-10-CM | POA: Diagnosis not present

## 2017-06-16 DIAGNOSIS — R1311 Dysphagia, oral phase: Secondary | ICD-10-CM | POA: Diagnosis not present

## 2017-06-22 DIAGNOSIS — R1311 Dysphagia, oral phase: Secondary | ICD-10-CM | POA: Diagnosis not present

## 2017-06-24 ENCOUNTER — Encounter: Payer: Self-pay | Admitting: Neurology

## 2017-07-04 ENCOUNTER — Emergency Department (HOSPITAL_COMMUNITY): Payer: Medicare Other

## 2017-07-04 ENCOUNTER — Inpatient Hospital Stay (HOSPITAL_COMMUNITY)
Admission: EM | Admit: 2017-07-04 | Discharge: 2017-07-08 | DRG: 470 | Disposition: A | Discharge status: Skilled Nursing Facility | Payer: Medicare Other | Attending: Internal Medicine | Admitting: Internal Medicine

## 2017-07-04 ENCOUNTER — Encounter (HOSPITAL_COMMUNITY): Payer: Self-pay

## 2017-07-04 ENCOUNTER — Other Ambulatory Visit: Payer: Self-pay

## 2017-07-04 DIAGNOSIS — Z7401 Bed confinement status: Secondary | ICD-10-CM | POA: Diagnosis not present

## 2017-07-04 DIAGNOSIS — F0390 Unspecified dementia without behavioral disturbance: Secondary | ICD-10-CM | POA: Diagnosis not present

## 2017-07-04 DIAGNOSIS — Y92099 Unspecified place in other non-institutional residence as the place of occurrence of the external cause: Secondary | ICD-10-CM

## 2017-07-04 DIAGNOSIS — D696 Thrombocytopenia, unspecified: Secondary | ICD-10-CM

## 2017-07-04 DIAGNOSIS — Z9049 Acquired absence of other specified parts of digestive tract: Secondary | ICD-10-CM

## 2017-07-04 DIAGNOSIS — G301 Alzheimer's disease with late onset: Secondary | ICD-10-CM | POA: Diagnosis not present

## 2017-07-04 DIAGNOSIS — G309 Alzheimer's disease, unspecified: Secondary | ICD-10-CM | POA: Diagnosis present

## 2017-07-04 DIAGNOSIS — I517 Cardiomegaly: Secondary | ICD-10-CM | POA: Diagnosis not present

## 2017-07-04 DIAGNOSIS — S72002A Fracture of unspecified part of neck of left femur, initial encounter for closed fracture: Principal | ICD-10-CM | POA: Diagnosis present

## 2017-07-04 DIAGNOSIS — Z66 Do not resuscitate: Secondary | ICD-10-CM | POA: Diagnosis not present

## 2017-07-04 DIAGNOSIS — Y92009 Unspecified place in unspecified non-institutional (private) residence as the place of occurrence of the external cause: Secondary | ICD-10-CM

## 2017-07-04 DIAGNOSIS — R531 Weakness: Secondary | ICD-10-CM | POA: Diagnosis not present

## 2017-07-04 DIAGNOSIS — Z880 Allergy status to penicillin: Secondary | ICD-10-CM

## 2017-07-04 DIAGNOSIS — F0281 Dementia in other diseases classified elsewhere with behavioral disturbance: Secondary | ICD-10-CM | POA: Diagnosis not present

## 2017-07-04 DIAGNOSIS — Z823 Family history of stroke: Secondary | ICD-10-CM | POA: Diagnosis not present

## 2017-07-04 DIAGNOSIS — H9190 Unspecified hearing loss, unspecified ear: Secondary | ICD-10-CM | POA: Diagnosis not present

## 2017-07-04 DIAGNOSIS — W19XXXA Unspecified fall, initial encounter: Secondary | ICD-10-CM | POA: Diagnosis not present

## 2017-07-04 DIAGNOSIS — Z974 Presence of external hearing-aid: Secondary | ICD-10-CM | POA: Diagnosis not present

## 2017-07-04 DIAGNOSIS — E559 Vitamin D deficiency, unspecified: Secondary | ICD-10-CM | POA: Diagnosis present

## 2017-07-04 DIAGNOSIS — I10 Essential (primary) hypertension: Secondary | ICD-10-CM | POA: Diagnosis present

## 2017-07-04 DIAGNOSIS — E876 Hypokalemia: Secondary | ICD-10-CM | POA: Diagnosis not present

## 2017-07-04 DIAGNOSIS — S72012A Unspecified intracapsular fracture of left femur, initial encounter for closed fracture: Secondary | ICD-10-CM | POA: Diagnosis not present

## 2017-07-04 DIAGNOSIS — Z8673 Personal history of transient ischemic attack (TIA), and cerebral infarction without residual deficits: Secondary | ICD-10-CM | POA: Diagnosis not present

## 2017-07-04 DIAGNOSIS — F329 Major depressive disorder, single episode, unspecified: Secondary | ICD-10-CM | POA: Diagnosis not present

## 2017-07-04 DIAGNOSIS — D649 Anemia, unspecified: Secondary | ICD-10-CM | POA: Diagnosis present

## 2017-07-04 DIAGNOSIS — S72002D Fracture of unspecified part of neck of left femur, subsequent encounter for closed fracture with routine healing: Secondary | ICD-10-CM | POA: Diagnosis not present

## 2017-07-04 DIAGNOSIS — W1830XA Fall on same level, unspecified, initial encounter: Secondary | ICD-10-CM | POA: Diagnosis present

## 2017-07-04 DIAGNOSIS — Z801 Family history of malignant neoplasm of trachea, bronchus and lung: Secondary | ICD-10-CM | POA: Diagnosis not present

## 2017-07-04 DIAGNOSIS — Z96649 Presence of unspecified artificial hip joint: Secondary | ICD-10-CM

## 2017-07-04 DIAGNOSIS — M25552 Pain in left hip: Secondary | ICD-10-CM | POA: Diagnosis not present

## 2017-07-04 DIAGNOSIS — F0280 Dementia in other diseases classified elsewhere without behavioral disturbance: Secondary | ICD-10-CM | POA: Diagnosis not present

## 2017-07-04 DIAGNOSIS — R9431 Abnormal electrocardiogram [ECG] [EKG]: Secondary | ICD-10-CM | POA: Diagnosis not present

## 2017-07-04 DIAGNOSIS — Z9181 History of falling: Secondary | ICD-10-CM | POA: Diagnosis not present

## 2017-07-04 DIAGNOSIS — G459 Transient cerebral ischemic attack, unspecified: Secondary | ICD-10-CM | POA: Diagnosis not present

## 2017-07-04 DIAGNOSIS — M2550 Pain in unspecified joint: Secondary | ICD-10-CM | POA: Diagnosis not present

## 2017-07-04 DIAGNOSIS — Z888 Allergy status to other drugs, medicaments and biological substances status: Secondary | ICD-10-CM

## 2017-07-04 DIAGNOSIS — F0391 Unspecified dementia with behavioral disturbance: Secondary | ICD-10-CM | POA: Diagnosis not present

## 2017-07-04 DIAGNOSIS — I080 Rheumatic disorders of both mitral and aortic valves: Secondary | ICD-10-CM | POA: Diagnosis present

## 2017-07-04 DIAGNOSIS — S72009B Fracture of unspecified part of neck of unspecified femur, initial encounter for open fracture type I or II: Secondary | ICD-10-CM | POA: Diagnosis not present

## 2017-07-04 DIAGNOSIS — Z96642 Presence of left artificial hip joint: Secondary | ICD-10-CM | POA: Diagnosis not present

## 2017-07-04 DIAGNOSIS — I4581 Long QT syndrome: Secondary | ICD-10-CM | POA: Diagnosis not present

## 2017-07-04 DIAGNOSIS — Z471 Aftercare following joint replacement surgery: Secondary | ICD-10-CM | POA: Diagnosis not present

## 2017-07-04 LAB — BASIC METABOLIC PANEL
ANION GAP: 10 (ref 5–15)
BUN: 16 mg/dL (ref 6–20)
CALCIUM: 9.2 mg/dL (ref 8.9–10.3)
CO2: 27 mmol/L (ref 22–32)
Chloride: 106 mmol/L (ref 101–111)
Creatinine, Ser: 0.86 mg/dL (ref 0.44–1.00)
GFR calc non Af Amer: 57 mL/min — ABNORMAL LOW (ref >60)
GLUCOSE: 111 mg/dL — AB (ref 65–99)
Potassium: 3.8 mmol/L (ref 3.5–5.1)
Sodium: 143 mmol/L (ref 135–145)

## 2017-07-04 LAB — CBC WITH DIFFERENTIAL/PLATELET
ABS IMMATURE GRANULOCYTES: 0 10*3/uL (ref 0.0–0.1)
Basophils Absolute: 0 10*3/uL (ref 0.0–0.1)
Basophils Relative: 0 %
Eosinophils Absolute: 0 10*3/uL (ref 0.0–0.7)
Eosinophils Relative: 0 %
HEMATOCRIT: 45.5 % (ref 36.0–46.0)
HEMOGLOBIN: 14.5 g/dL (ref 12.0–15.0)
IMMATURE GRANULOCYTES: 0 %
LYMPHS ABS: 0.7 10*3/uL (ref 0.7–4.0)
LYMPHS PCT: 8 %
MCH: 29.2 pg (ref 26.0–34.0)
MCHC: 31.9 g/dL (ref 30.0–36.0)
MCV: 91.5 fL (ref 78.0–100.0)
MONO ABS: 0.6 10*3/uL (ref 0.1–1.0)
MONOS PCT: 6 %
NEUTROS ABS: 8.2 10*3/uL — AB (ref 1.7–7.7)
NEUTROS PCT: 86 %
PLATELETS: 82 10*3/uL — AB (ref 150–400)
RBC: 4.97 MIL/uL (ref 3.87–5.11)
RDW: 14.4 % (ref 11.5–15.5)
WBC: 9.5 10*3/uL (ref 4.0–10.5)

## 2017-07-04 LAB — TYPE AND SCREEN
ABO/RH(D): B POS
ANTIBODY SCREEN: NEGATIVE

## 2017-07-04 LAB — PROTIME-INR
INR: 1.07
Prothrombin Time: 13.8 seconds (ref 11.4–15.2)

## 2017-07-04 MED ORDER — FENTANYL CITRATE (PF) 100 MCG/2ML IJ SOLN
25.0000 ug | INTRAMUSCULAR | Status: DC | PRN
  Administered 2017-07-04 – 2017-07-05 (×2): 25 ug via INTRAVENOUS
  Filled 2017-07-04 (×2): qty 2

## 2017-07-04 NOTE — ED Triage Notes (Addendum)
Per [facility], pt arrives from [facility] for a fall that occurred some time last night. Staff was unable to tell EMS the cause, what the patient hit, or what time it was at. Staff noticed today patient wasn't getting up as much as usual and did an x-ray. X-ray confirmed left femoral head fracture. Pt has hx of falls, alzheimers, and is hard of hearing. Pt received 0.5 mg of ativan 20 mg which she is prescribed for her alzheimer's. Family reports fall occurred recently before 0300.

## 2017-07-04 NOTE — ED Provider Notes (Signed)
[facility] EMERGENCY DEPARTMENT Provider Note   CSN: [id number] Arrival date & time: 07/04/17  2051     History   Chief Complaint Chief Complaint  Patient presents with  . Fall    HPI Michelle Savage is a 84 y.o. female.  Patient with all times, [place] presents afterx-ray showed left hip fractur The fall occurred sometime yesterday an unwitnessed. Daughter checked on her today and found she was not walking normal and unable tut weight on the left leg.X-ray this evening revealed the hip fracture.Patient's pain is being controlled. Patient at baseline with her dementia gly worsening.     Past Medical History:  Diagnosis Date  . Alzheimer disease   . Anemia   . Aortic insufficiency   . Depression   . HOH (hard of hearing)   . Hypertension   . Memory loss   . Mitral valve regurgitation   . Vitamin D deficiency     Patient Active Problem List   Diagnosis Date Noted  . Fall at home, initial encounter 07/04/2017  . Closed left hip fracture, initial encounter (HCC) 07/04/2017  . Palliative care by specialist   . DNR (do not resuscitate)   . Weakness generalized   . Depression 04/26/2017  . Shortness of breath 04/26/2017  . Aspiration into airway 04/26/2017  . Memory deficit 10/08/2012  . TIA (transient ischemic attack) 12/30/2010  . Alzheimer's dementia 12/30/2010  . Hyperglycemia 12/30/2010  . Essential hypertension 01/15/2009    Past Surgical History:  Procedure Laterality Date  . CHOLECYSTECTOMY    . TONSILLECTOMY       OB History   None      Home Medications    Prior to Admission medications   Medication Sig Start Date End Date Taking? Authorizing Provider  donepezil (ARICEPT) 23 MG TABS tablet Take 1 tablet (23 mg total) by mouth at bedtime. 03/19/14  Yes York Spaniel, MD  ibuprofen (ADVIL,MOTRIN) 200 MG tablet Take 200 mg by mouth every 4 (four) hours as needed (pain/discomfort).   Yes  [provider]  lactose free nutrition (BOOST) LIQD Take 237 mLs by mouth 2 (two) times daily between meals. *chocolate*   Yes [provider]  loperamide (IMODIUM A-D) 2 MG tablet Take 2 mg by mouth every 6 (six) hours as needed for diarrhea or loose stools.   Yes [provider]  LORazepam (ATIVAN) 0.5 MG tablet Take 0.25 mg by mouth 2 (two) times daily as needed for anxiety. With daughters permission, Hard copy Rx Required. 06/01/17  Yes [provider]  memantine (NAMENDA) 10 MG tablet Take 1 tablet (10 mg total) by mouth 2 (two) times daily. 03/19/14  Yes York Spaniel, MD  OLANZapine (ZYPREXA) 2.5 MG tablet Take 2.5 mg by mouth daily.   Yes [provider]  sertraline (ZOLOFT) 100 MG tablet Take 100 mg by mouth daily.  03/11/16  Yes [provider]    Family History Family History  Problem Relation Age of Onset  . Lung cancer Mother   . Dementia Sister   . Stroke Sister     Social History Social History   Tobacco Use  . Smoking status: Never Smoker  . Smokeless tobacco: Never Used  Substance Use Topics  . Alcohol use: No    Alcohol/week: 0.0 oz  . Drug use: No     Allergies   Penicillins and Ativan [lorazepam]   Review of Systems Review of Systems  Unable  to perform ROS: Dementia     Physical Exam Updated Vital Signs BP (!) 162/67   Pulse 69   Temp 98.5 F (36.9 C) (Oral)   Resp 20   Ht  (1.575 m)   Wt 56.7 kg (125 lb)   SpO2 93%   BMI 22.86 kg/m   Physical Exam  Constitutional: She appears well-developed and well-nourished.  HENT:  Head: Normocephalic and atraumatic.  Eyes: Conjunctivae are normal. Right eye exhibits no discharge. Left eye exhibits no discharge.  Neck: Normal range of motion. Neck supple. No tracheal deviation present.  Cardiovascular: Normal rate.  Pulmonary/Chest: Effort normal.  Abdominal: Soft. She exhibits no distension. There is no tenderness. There is no guarding.    Musculoskeletal: She exhibits tenderness. She exhibits no edema.  Patient has tenderness movement of the left hip. Mild shortening. No tenderness to the right hip or leg with external rotation.  Neurological: She is alert.  Pleasant dementia  Skin: Skin is warm. No rash noted.  Psychiatric:  Pleasant dementia  Nursing note and vitals reviewed.    ED Treatments / Results  Labs (all labs ordered are listed, but only abnormal results are displayed) Labs Reviewed  BASIC METABOLIC PANEL - Abnormal; Notable for the following components:      Result Value   Glucose, Bld 111 (*)    GFR calc non Af Amer 57 (*)    All other components within normal limits  CBC WITH DIFFERENTIAL/PLATELET - Abnormal; Notable for the following components:   Neutro Abs 8.2 (*)    All other components within normal limits  PROTIME-INR  URINALYSIS, ROUTINE W REFLEX MICROSCOPIC  TYPE AND SCREEN  ABO/RH    EKG EKG Interpretation  Date/Time:  Sunday Jul 04 2017 22:22:29 EDT Ventricular Rate:  87 PR Interval:    QRS Duration: 86 QT Interval:  424 QTC Calculation: 511 R Axis:   61 Text Interpretation:  Sinus arrhythmia Prolonged QT interval Confirmed by Blane Ohara [phone]) on 07/04/2017 10:51:08 PM   Radiology Dg Chest 1 View  Result Date: 07/04/2017 CLINICAL DATA:  Preop EXAM: CHEST  1 VIEW COMPARISON:  04/27/2017 FINDINGS: Cardiomegaly. No confluent airspace opacities, effusions or edema. No acute bony abnormality. IMPRESSION: Cardiomegaly.  No active disease. Electronically Signed   By: Charlett Nose M.D.   On: 07/04/2017 23:18   Dg Hip Unilat With Pelvis 2-3 Views Left  Result Date: 07/04/2017 CLINICAL DATA:  Fall EXAM: DG HIP (WITH OR WITHOUT PELVIS) 2-3V LEFT COMPARISON:  None. FINDINGS: There is a left femoral neck fracture with impaction and varus angulation. Mild degenerative changes in the hips bilaterally. No subluxation or dislocation. IMPRESSION: Left femoral neck fracture with impaction  and varus angulation. Electronically Signed   By: Charlett Nose M.D.   On: 07/04/2017 23:19    Procedures Procedures (including critical care time)  Medications Ordered in ED Medications  fentaNYL (SUBLIMAZE) injection 25 mcg (has no administration in time range)     Initial Impression / Assessment and Plan / ED Course  I have reviewed the triage vital signs and the nursing notes.  Pertinent labs & imaging results that were available during my care of the patient were reviewed by me and considered in my medical decision making (see chart for details).    Patient presents with left hip fracture. Plan for basic blood work, x-ray reviewed, orthopedic consult and hospice admission. Preop EKG obtained and reviewed.  The patients results and plan were reviewed and discussed.   Any  x-rays performed were independently reviewed by myself.   Differential diagnosis were considered with the presenting HPI.  Medications  fentaNYL (SUBLIMAZE) injection 25 mcg (has no administration in time range)    Vitals:   07/04/17 2055 07/04/17 2103 07/04/17 2109 07/04/17 2145  BP:   (!) 176/67 (!) 162/67  Pulse:   72 69  Resp:   15 20  Temp:   98.5 F (36.9 C)   TempSrc:   Oral   SpO2: 94%  93%   Weight:  56.7 kg (125 lb)    Height:   (1.575 m)      Final diagnoses:  Closed fracture of left hip, initial encounter (HCC)  Fall, initial encounter    Admission/ observation were discussed with the admitting physician, patient and/or family and they are comfortable with the plan.    Final Clinical Impressions(s) / ED Diagnoses   Final diagnoses:  Closed fracture of left hip, initial encounter [facility])  Fall, initial encounter    ED Discharge Orders    None       Blane Ohara, MD 07/04/17 2342

## 2017-07-04 NOTE — H&P (Addendum)
History and Physical    Michelle Savage NWG:[id number] DOB: 1924-12-07 DOA: 07/04/2017  PCP: [name], MD   Patient coming from: Assisted living facility/memory care unit   Chief Complaint: Status post fall   HPI: Michelle Savage is a 84 y.o. female with medical history significant of advanced dementia, hard of hearing, hypertension, valvular heart disease from [place] [facility] presented to the hospital for evaluation of left hip fracture.  History was obtained from the patient's daughter at bedside due to patient's advanced dementia.  Patient had a fall at the facility and the patient daughter was informed about it.  Patient complained of pain and moving her hip and subsequently x-ray of the hip was done which showed left subcapital femoral head fracture.  Patient was then seen in our hospital for further evaluation and treatment.  At baseline patient requires help with bathing and grooming but mostly he is able to walk around and go to the bathroom and eat by herself.  She does have advanced dementia and she has been adjusted on medications by her primary care as well as neurologist.  ED Course: X-ray of the left hip done at the outside facility showed subcapital femoral head fracture.  Repeat x-ray done in the hospital informed left femoral neck fracture.  Patient was then considered for admission to the hospital.  Review of Systems: As per HPI otherwise 10 point review of systems negative.    Past Medical History:  Diagnosis Date  . Alzheimer disease   . Anemia   . Aortic insufficiency   . Depression   . HOH (hard of hearing)   . Hypertension   . Memory loss   . Mitral valve regurgitation   . Vitamin D deficiency     Past Surgical History:  Procedure Laterality Date  . CHOLECYSTECTOMY    . TONSILLECTOMY       reports that she has never smoked. She has never used smokeless tobacco. She reports that she does not drink alcohol or use drugs.  Allergies    Allergen Reactions  . Penicillins Rash    Has patient had a PCN reaction causing immediate rash, facial/tongue/throat swelling, SOB or lightheadedness with hypotension: Yes Has patient had a PCN reaction causing severe rash involving mucus membranes or skin necrosis: No Has patient had a PCN reaction that required hospitalization: Unknown Has patient had a PCN reaction occurring within the last 10 years: No If all of the above answers are "NO", then may proceed with Cephalosporin use.  . Ativan [Lorazepam]     Agitation     Family History  Problem Relation Age of Onset  . Lung cancer Mother   . Dementia Sister   . Stroke Sister      Prior to Admission medications   Medication Sig Start Date End Date Taking? Authorizing Provider  donepezil (ARICEPT) 23 MG TABS tablet Take 1 tablet (23 mg total) by mouth at bedtime. 03/19/14  Yes York Spaniel, MD  ibuprofen (ADVIL,MOTRIN) 200 MG tablet Take 200 mg by mouth every 4 (four) hours as needed (pain/discomfort).   Yes [provider]  lactose free nutrition (BOOST) LIQD Take 237 mLs by mouth 2 (two) times daily between meals. *chocolate*   Yes [provider]  loperamide (IMODIUM A-D) 2 MG tablet Take 2 mg by mouth every 6 (six) hours as needed for diarrhea or loose stools.   Yes [provider]  LORazepam (ATIVAN) 0.5 MG tablet Take 0.25 mg by mouth 2 (  two) times daily as needed for anxiety. With daughters permission, Hard copy Rx Required. 06/01/17  Yes [provider]  memantine (NAMENDA) 10 MG tablet Take 1 tablet (10 mg total) by mouth 2 (two) times daily. 03/19/14  Yes York Spaniel, MD  OLANZapine (ZYPREXA) 2.5 MG tablet Take 2.5 mg by mouth daily.   Yes [provider]  sertraline (ZOLOFT) 100 MG tablet Take 100 mg by mouth daily.  03/11/16  Yes [provider]    Physical Exam: Vitals:   07/04/17 2055 07/04/17 2103 07/04/17 2109 07/04/17 2145  BP:   (!) 176/67 (!) 162/67   Pulse:   72 69  Resp:   15 20  Temp:   98.5 F (36.9 C)   TempSrc:   Oral   SpO2: 94%  93%   Weight:  56.7 kg (125 lb)    Height:   (1.575 m)      Constitutional: NAD, calm, comfortable Vitals:   07/04/17 2055 07/04/17 2103 07/04/17 2109 07/04/17 2145  BP:   (!) 176/67 (!) 162/67  Pulse:   72 69  Resp:   15 20  Temp:   98.5 F (36.9 C)   TempSrc:   Oral   SpO2: 94%  93%   Weight:  56.7 kg (125 lb)    Height:   (1.575 m)     Eyes: PERRL, lids and conjunctivae normal ENMT: Mucous membranes are dry. Posterior pharynx clear of any exudate or lesions.Normal dentition.  Neck: normal, supple, no masses, no thyromegaly Respiratory: clear to auscultation bilaterally, no wheezing, no crackles. Normal respiratory effort. No accessory muscle use.  Cardiovascular: Regular rate and rhythm, no murmurs / rubs / gallops. No extremity edema. 2+ pedal pulses. No carotid bruits.  Abdomen: no tenderness, no masses palpated. No hepatosplenomegaly. Bowel sounds positive.  Musculoskeletal: no clubbing / cyanosis.  Left hip tenderness and restricted movement Skin: no rashes, lesions, ulcers. No induration Neurologic: CN 2-12 grossly intact. Sensation intact, DTR normal. Strength 5/5 in all 4.  Psychiatric: Normal judgment and insight.  Confused disoriented with baseline dementia. Normal mood.    Labs on Admission: I have personally reviewed following labs and imaging studies  CBC: Recent Labs  Lab 07/04/17 2210  WBC 9.5  NEUTROABS 8.2*  HGB 14.5  HCT 45.5  MCV 91.5  PLT PENDING   Basic Metabolic Panel: Recent Labs  Lab 07/04/17 2210  NA 143  K 3.8  CL 106  CO2 27  GLUCOSE 111*  BUN 16  CREATININE 0.86  CALCIUM 9.2   GFR: Estimated Creatinine Clearance: 33.7 mL/min (by C-G formula based on SCr of 0.86 mg/dL). Liver Function Tests: No results for input(s): AST, ALT, ALKPHOS, BILITOT, PROT, ALBUMIN in the last 168 hours. No results for input(s): LIPASE, AMYLASE in the  last 168 hours. No results for input(s): AMMONIA in the last 168 hours. Coagulation Profile: Recent Labs  Lab 07/04/17 2210  INR 1.07   Cardiac Enzymes: No results for input(s): CKTOTAL, CKMB, CKMBINDEX, TROPONINI in the last 168 hours. BNP (last 3 results) No results for input(s): PROBNP in the last 8760 hours. HbA1C: No results for input(s): HGBA1C in the last 72 hours. CBG: No results for input(s): GLUCAP in the last 168 hours. Lipid Profile: No results for input(s): CHOL, HDL, LDLCALC, TRIG, CHOLHDL, LDLDIRECT in the last 72 hours. Thyroid Function Tests: No results for input(s): TSH, T4TOTAL, FREET4, T3FREE, THYROIDAB in the last 72 hours. Anemia Panel: No results for input(s): VITAMINB12,  FOLATE, FERRITIN, TIBC, IRON, RETICCTPCT in the last 72 hours. Urine analysis:    Component Value Date/Time   COLORURINE YELLOW 04/26/2017 1747   APPEARANCEUR CLOUDY (A) 04/26/2017 1747   LABSPEC 1.023 04/26/2017 1747   PHURINE 5.0 04/26/2017 1747   GLUCOSEU NEGATIVE 04/26/2017 1747   HGBUR NEGATIVE 04/26/2017 1747   BILIRUBINUR NEGATIVE 04/26/2017 1747   KETONESUR 5 (A) 04/26/2017 1747   PROTEINUR 100 (A) 04/26/2017 1747   UROBILINOGEN 1.0 12/30/2010 1425   NITRITE NEGATIVE 04/26/2017 1747   LEUKOCYTESUR NEGATIVE 04/26/2017 1747    Radiological Exams on Admission: Dg Chest 1 View  Result Date: 07/04/2017 CLINICAL DATA:  Preop EXAM: CHEST  1 VIEW COMPARISON:  04/27/2017 FINDINGS: Cardiomegaly. No confluent airspace opacities, effusions or edema. No acute bony abnormality. IMPRESSION: Cardiomegaly.  No active disease. Electronically Signed   By: Charlett Nose M.D.   On: 07/04/2017 23:18   Dg Hip Unilat With Pelvis 2-3 Views Left  Result Date: 07/04/2017 CLINICAL DATA:  Fall EXAM: DG HIP (WITH OR WITHOUT PELVIS) 2-3V LEFT COMPARISON:  None. FINDINGS: There is a left femoral neck fracture with impaction and varus angulation. Mild degenerative changes in the hips bilaterally. No  subluxation or dislocation. IMPRESSION: Left femoral neck fracture with impaction and varus angulation. Electronically Signed   By: Charlett Nose M.D.   On: 07/04/2017 23:19    Assessment/Plan Closed left hip fracture- initial encounter.  Elderly patient with advanced dementia but there are no significant medical comorbidities.  She has a low to moderate risk for intermediate surgery.  I have spoken with the patient's daughter at bedside regarding this.  Orthopedics Dr Renaye Rakers has been consulted from the ED.  Will follow orthopedic recommendation.  Continue on IV fluids keep n.p.o. after midnight.  Focus on analgesia.  Fall precautions.  Patient might need skilled nursing facility level of care on discharge.  History of valvular heart disease but no ischemic disease.  She appears to be compensated.  History of advanced dementia with behavioral issues.  Continue with Aricept and Namenda olanzapine and Remeron from outpatient.  Patient also takes Ativan for agitation.  I certify that at the point of admission it is my clinical judgment that the patient will require inpatient hospital care spanning beyond 2 midnights from the point of admission due to high intensity of service, high risk for further deterioration and high frequency of surveillance required.    DVT prophylaxis: Lovenox starting tomorrow, we will hold for tonight.  Code Status: DNR DNI  Family Communication: I spoke with the patient's daughter at bedside and updated her about the clinical condition of the patient and the plan for inpatient admission.  Consults called: Orthopedics from the ED   Joycelyn Das MD [facility] Pager [phone]  If 7PM-7AM, please contact night-coverage [email or web address] Password [facility]  07/04/2017, 11:41 PM

## 2017-07-04 NOTE — ED Notes (Signed)
Pt placed on 4 L [place]  

## 2017-07-05 ENCOUNTER — Encounter (HOSPITAL_COMMUNITY): Payer: Self-pay | Admitting: Certified Registered"

## 2017-07-05 ENCOUNTER — Encounter (HOSPITAL_COMMUNITY)
Admission: EM | Disposition: A | Discharge status: Skilled Nursing Facility | Payer: Self-pay | Source: Home / Self Care | Attending: Family Medicine

## 2017-07-05 ENCOUNTER — Inpatient Hospital Stay (HOSPITAL_COMMUNITY): Payer: Medicare Other

## 2017-07-05 ENCOUNTER — Inpatient Hospital Stay (HOSPITAL_COMMUNITY): Payer: Medicare Other | Admitting: Anesthesiology

## 2017-07-05 DIAGNOSIS — W19XXXA Unspecified fall, initial encounter: Secondary | ICD-10-CM

## 2017-07-05 DIAGNOSIS — R531 Weakness: Secondary | ICD-10-CM

## 2017-07-05 DIAGNOSIS — Y92009 Unspecified place in unspecified non-institutional (private) residence as the place of occurrence of the external cause: Secondary | ICD-10-CM

## 2017-07-05 DIAGNOSIS — F0281 Dementia in other diseases classified elsewhere with behavioral disturbance: Secondary | ICD-10-CM

## 2017-07-05 DIAGNOSIS — G301 Alzheimer's disease with late onset: Secondary | ICD-10-CM

## 2017-07-05 DIAGNOSIS — R9431 Abnormal electrocardiogram [ECG] [EKG]: Secondary | ICD-10-CM

## 2017-07-05 DIAGNOSIS — D696 Thrombocytopenia, unspecified: Secondary | ICD-10-CM

## 2017-07-05 DIAGNOSIS — Z66 Do not resuscitate: Secondary | ICD-10-CM

## 2017-07-05 DIAGNOSIS — I10 Essential (primary) hypertension: Secondary | ICD-10-CM

## 2017-07-05 DIAGNOSIS — S72002A Fracture of unspecified part of neck of left femur, initial encounter for closed fracture: Principal | ICD-10-CM

## 2017-07-05 HISTORY — PX: HIP ARTHROPLASTY: SHX981

## 2017-07-05 LAB — COMPREHENSIVE METABOLIC PANEL
ALK PHOS: 67 U/L (ref 38–126)
ALT: 16 U/L (ref 14–54)
AST: 23 U/L (ref 15–41)
Albumin: 2.8 g/dL — ABNORMAL LOW (ref 3.5–5.0)
Anion gap: 10 (ref 5–15)
BUN: 16 mg/dL (ref 6–20)
CALCIUM: 8.5 mg/dL — AB (ref 8.9–10.3)
CHLORIDE: 107 mmol/L (ref 101–111)
CO2: 24 mmol/L (ref 22–32)
CREATININE: 0.8 mg/dL (ref 0.44–1.00)
GFR calc Af Amer: >60 mL/min (ref >60)
GFR calc non Af Amer: >60 mL/min (ref >60)
Glucose, Bld: 99 mg/dL (ref 65–99)
Potassium: 3.4 mmol/L — ABNORMAL LOW (ref 3.5–5.1)
SODIUM: 141 mmol/L (ref 135–145)
Total Bilirubin: 1 mg/dL (ref 0.3–1.2)
Total Protein: 6 g/dL — ABNORMAL LOW (ref 6.5–8.1)

## 2017-07-05 LAB — CBC
HEMATOCRIT: 38.4 % (ref 36.0–46.0)
HEMOGLOBIN: 12.5 g/dL (ref 12.0–15.0)
MCH: 29.8 pg (ref 26.0–34.0)
MCHC: 32.6 g/dL (ref 30.0–36.0)
MCV: 91.6 fL (ref 78.0–100.0)
Platelets: 120 10*3/uL — ABNORMAL LOW (ref 150–400)
RBC: 4.19 MIL/uL (ref 3.87–5.11)
RDW: 14.4 % (ref 11.5–15.5)
WBC: 8.5 10*3/uL (ref 4.0–10.5)

## 2017-07-05 LAB — GLUCOSE, CAPILLARY: Glucose-Capillary: 74 mg/dL (ref 65–99)

## 2017-07-05 LAB — ABO/RH: ABO/RH(D): B POS

## 2017-07-05 LAB — MAGNESIUM: MAGNESIUM: 2.1 mg/dL (ref 1.7–2.4)

## 2017-07-05 SURGERY — HEMIARTHROPLASTY, HIP, DIRECT ANTERIOR APPROACH, FOR FRACTURE
Anesthesia: General | Site: Hip | Laterality: Left

## 2017-07-05 MED ORDER — FENTANYL CITRATE (PF) 100 MCG/2ML IJ SOLN
INTRAMUSCULAR | Status: DC | PRN
  Administered 2017-07-05: 100 ug via INTRAVENOUS

## 2017-07-05 MED ORDER — CHLORHEXIDINE GLUCONATE 4 % EX LIQD: 60.0000 mL | Freq: Once | CUTANEOUS | Status: DC

## 2017-07-05 MED ORDER — POTASSIUM CHLORIDE 10 MEQ/100ML IV SOLN
10.0000 meq | INTRAVENOUS | Status: AC
  Administered 2017-07-05 (×2): 10 meq via INTRAVENOUS
  Filled 2017-07-05 (×3): qty 100

## 2017-07-05 MED ORDER — METHOCARBAMOL 1000 MG/10ML IJ SOLN
500.0000 mg | Freq: Four times a day (QID) | INTRAVENOUS | Status: DC | PRN
  Filled 2017-07-05: qty 5

## 2017-07-05 MED ORDER — ONDANSETRON HCL 4 MG/2ML IJ SOLN
INTRAMUSCULAR | Status: AC
  Filled 2017-07-05: qty 2

## 2017-07-05 MED ORDER — PROPOFOL 10 MG/ML IV BOLUS
INTRAVENOUS | Status: DC | PRN
  Administered 2017-07-05: 80 mg via INTRAVENOUS

## 2017-07-05 MED ORDER — PHENYLEPHRINE 40 MCG/ML (10ML) SYRINGE FOR IV PUSH (FOR BLOOD PRESSURE SUPPORT)
PREFILLED_SYRINGE | INTRAVENOUS | Status: AC
  Filled 2017-07-05: qty 10

## 2017-07-05 MED ORDER — ASPIRIN 81 MG PO CHEW: 81.0000 mg | CHEWABLE_TABLET | Freq: Two times a day (BID) | ORAL | 0 refills | Status: DC

## 2017-07-05 MED ORDER — IBUPROFEN 200 MG PO TABS: 200.0000 mg | ORAL_TABLET | ORAL | Status: DC | PRN

## 2017-07-05 MED ORDER — ONDANSETRON HCL 4 MG/2ML IJ SOLN: 4.0000 mg | Freq: Four times a day (QID) | INTRAMUSCULAR | Status: DC | PRN

## 2017-07-05 MED ORDER — LOPERAMIDE HCL 2 MG PO CAPS: 2.0000 mg | ORAL_CAPSULE | Freq: Four times a day (QID) | ORAL | Status: DC | PRN

## 2017-07-05 MED ORDER — ACETAMINOPHEN 650 MG RE SUPP: 650.0000 mg | Freq: Four times a day (QID) | RECTAL | Status: DC | PRN

## 2017-07-05 MED ORDER — LACTATED RINGERS IV SOLN
INTRAVENOUS | Status: DC
Start: 2017-07-05 — End: 2017-07-08
  Administered 2017-07-05: 16:00:00 via INTRAVENOUS

## 2017-07-05 MED ORDER — DOCUSATE SODIUM 100 MG PO CAPS: 100.0000 mg | ORAL_CAPSULE | Freq: Two times a day (BID) | ORAL | 0 refills | Status: AC

## 2017-07-05 MED ORDER — METOCLOPRAMIDE HCL 5 MG/ML IJ SOLN: 5.0000 mg | Freq: Three times a day (TID) | INTRAMUSCULAR | Status: DC | PRN

## 2017-07-05 MED ORDER — POVIDONE-IODINE 10 % EX SWAB: 2.0000 "application " | Freq: Once | CUTANEOUS | Status: DC

## 2017-07-05 MED ORDER — SUGAMMADEX SODIUM 200 MG/2ML IV SOLN
INTRAVENOUS | Status: DC | PRN
  Administered 2017-07-05: 50 mg via INTRAVENOUS

## 2017-07-05 MED ORDER — IBUPROFEN 200 MG PO TABS
200.0000 mg | ORAL_TABLET | ORAL | Status: DC | PRN
  Filled 2017-07-05: qty 1

## 2017-07-05 MED ORDER — HYDROCODONE-ACETAMINOPHEN 5-325 MG PO TABS
1.0000 | ORAL_TABLET | ORAL | Status: DC | PRN
  Administered 2017-07-06 (×2): 1 via ORAL
  Filled 2017-07-05 (×3): qty 1

## 2017-07-05 MED ORDER — ENOXAPARIN SODIUM 40 MG/0.4ML ~~LOC~~ SOLN: 40.0000 mg | SUBCUTANEOUS | Status: DC

## 2017-07-05 MED ORDER — SERTRALINE HCL 100 MG PO TABS
100.0000 mg | ORAL_TABLET | Freq: Every day | ORAL | Status: DC
  Filled 2017-07-05: qty 1

## 2017-07-05 MED ORDER — FERROUS SULFATE 325 (65 FE) MG PO TABS: 325.0000 mg | ORAL_TABLET | Freq: Three times a day (TID) | ORAL | 3 refills | Status: DC

## 2017-07-05 MED ORDER — DEXAMETHASONE SODIUM PHOSPHATE 10 MG/ML IJ SOLN
INTRAMUSCULAR | Status: DC | PRN
  Administered 2017-07-05: 5 mg via INTRAVENOUS

## 2017-07-05 MED ORDER — LIDOCAINE 2% (20 MG/ML) 5 ML SYRINGE
INTRAMUSCULAR | Status: DC | PRN
  Administered 2017-07-05: 60 mg via INTRAVENOUS

## 2017-07-05 MED ORDER — CLINDAMYCIN PHOSPHATE 900 MG/50ML IV SOLN
900.0000 mg | INTRAVENOUS | Status: AC
  Administered 2017-07-05: 900 mg via INTRAVENOUS

## 2017-07-05 MED ORDER — FERROUS SULFATE 325 (65 FE) MG PO TABS
325.0000 mg | ORAL_TABLET | Freq: Three times a day (TID) | ORAL | Status: DC
  Administered 2017-07-06 – 2017-07-08 (×3): 325 mg via ORAL
  Filled 2017-07-05 (×4): qty 1

## 2017-07-05 MED ORDER — POLYETHYLENE GLYCOL 3350 17 G PO PACK
17.0000 g | PACK | Freq: Every day | ORAL | Status: DC | PRN
  Administered 2017-07-06: 17 g via ORAL
  Filled 2017-07-05: qty 1

## 2017-07-05 MED ORDER — SODIUM CHLORIDE 0.9 % IV SOLN
INTRAVENOUS | Status: DC
  Administered 2017-07-05: 1000 mL via INTRAVENOUS

## 2017-07-05 MED ORDER — HYDROCODONE-ACETAMINOPHEN 5-325 MG PO TABS: 2.0000 | ORAL_TABLET | ORAL | Status: DC | PRN

## 2017-07-05 MED ORDER — ONDANSETRON HCL 4 MG/2ML IJ SOLN
INTRAMUSCULAR | Status: DC | PRN
  Administered 2017-07-05: 4 mg via INTRAVENOUS

## 2017-07-05 MED ORDER — 0.9 % SODIUM CHLORIDE (POUR BTL) OPTIME
TOPICAL | Status: DC | PRN
  Administered 2017-07-05: 1000 mL

## 2017-07-05 MED ORDER — ROCURONIUM BROMIDE 10 MG/ML (PF) SYRINGE
PREFILLED_SYRINGE | INTRAVENOUS | Status: AC
  Filled 2017-07-05: qty 5

## 2017-07-05 MED ORDER — ONDANSETRON HCL 4 MG PO TABS: 4.0000 mg | ORAL_TABLET | Freq: Four times a day (QID) | ORAL | Status: DC | PRN

## 2017-07-05 MED ORDER — FENTANYL CITRATE (PF) 250 MCG/5ML IJ SOLN
INTRAMUSCULAR | Status: AC
  Filled 2017-07-05: qty 5

## 2017-07-05 MED ORDER — LORAZEPAM 0.5 MG PO TABS
0.2500 mg | ORAL_TABLET | Freq: Two times a day (BID) | ORAL | Status: DC | PRN
  Administered 2017-07-06: 0.25 mg via ORAL
  Filled 2017-07-05 (×2): qty 1

## 2017-07-05 MED ORDER — PROPOFOL 10 MG/ML IV BOLUS
INTRAVENOUS | Status: AC
  Filled 2017-07-05: qty 20

## 2017-07-05 MED ORDER — CELECOXIB 200 MG PO CAPS
200.0000 mg | ORAL_CAPSULE | Freq: Two times a day (BID) | ORAL | Status: DC
  Administered 2017-07-06 – 2017-07-08 (×4): 200 mg via ORAL
  Filled 2017-07-05 (×4): qty 1

## 2017-07-05 MED ORDER — OLANZAPINE 2.5 MG PO TABS
2.5000 mg | ORAL_TABLET | Freq: Every day | ORAL | Status: DC
  Filled 2017-07-05: qty 1

## 2017-07-05 MED ORDER — DOCUSATE SODIUM 100 MG PO CAPS
100.0000 mg | ORAL_CAPSULE | Freq: Two times a day (BID) | ORAL | Status: DC
  Administered 2017-07-05 – 2017-07-08 (×4): 100 mg via ORAL
  Filled 2017-07-05 (×5): qty 1

## 2017-07-05 MED ORDER — LIDOCAINE 2% (20 MG/ML) 5 ML SYRINGE
INTRAMUSCULAR | Status: AC
  Filled 2017-07-05: qty 5

## 2017-07-05 MED ORDER — PHENOL 1.4 % MT LIQD: 1.0000 | OROMUCOSAL | Status: DC | PRN

## 2017-07-05 MED ORDER — SUGAMMADEX SODIUM 200 MG/2ML IV SOLN
INTRAVENOUS | Status: AC
  Filled 2017-07-05: qty 2

## 2017-07-05 MED ORDER — METOCLOPRAMIDE HCL 5 MG PO TABS: 5.0000 mg | ORAL_TABLET | Freq: Three times a day (TID) | ORAL | Status: DC | PRN

## 2017-07-05 MED ORDER — POLYETHYLENE GLYCOL 3350 17 G PO PACK
17.0000 g | PACK | Freq: Two times a day (BID) | ORAL | 0 refills | Status: DC
Start: 2017-07-05 — End: 2017-07-08

## 2017-07-05 MED ORDER — ROCURONIUM BROMIDE 10 MG/ML (PF) SYRINGE
PREFILLED_SYRINGE | INTRAVENOUS | Status: DC | PRN
  Administered 2017-07-05: 30 mg via INTRAVENOUS

## 2017-07-05 MED ORDER — MEMANTINE HCL 10 MG PO TABS
10.0000 mg | ORAL_TABLET | Freq: Two times a day (BID) | ORAL | Status: DC
  Administered 2017-07-05 – 2017-07-08 (×5): 10 mg via ORAL
  Filled 2017-07-05 (×6): qty 1

## 2017-07-05 MED ORDER — CLINDAMYCIN PHOSPHATE 900 MG/50ML IV SOLN
INTRAVENOUS | Status: AC
  Filled 2017-07-05: qty 50

## 2017-07-05 MED ORDER — HYDROCODONE-ACETAMINOPHEN 5-325 MG PO TABS: 1.0000 | ORAL_TABLET | Freq: Four times a day (QID) | ORAL | 0 refills | Status: DC | PRN

## 2017-07-05 MED ORDER — MENTHOL 3 MG MT LOZG: 1.0000 | LOZENGE | OROMUCOSAL | Status: DC | PRN

## 2017-07-05 MED ORDER — VANCOMYCIN HCL IN DEXTROSE 1-5 GM/200ML-% IV SOLN
1000.0000 mg | Freq: Two times a day (BID) | INTRAVENOUS | Status: AC
  Administered 2017-07-06: 1000 mg via INTRAVENOUS
  Filled 2017-07-05: qty 200

## 2017-07-05 MED ORDER — PHENYLEPHRINE 40 MCG/ML (10ML) SYRINGE FOR IV PUSH (FOR BLOOD PRESSURE SUPPORT)
PREFILLED_SYRINGE | INTRAVENOUS | Status: DC | PRN
  Administered 2017-07-05: 80 ug via INTRAVENOUS

## 2017-07-05 MED ORDER — METHOCARBAMOL 500 MG PO TABS: 500.0000 mg | ORAL_TABLET | Freq: Four times a day (QID) | ORAL | 0 refills | Status: AC | PRN

## 2017-07-05 MED ORDER — POTASSIUM CHLORIDE 10 MEQ/100ML IV SOLN
10.0000 meq | INTRAVENOUS | Status: AC
  Administered 2017-07-05 (×2): 10 meq via INTRAVENOUS
  Filled 2017-07-05 (×5): qty 100

## 2017-07-05 MED ORDER — LACTATED RINGERS IV SOLN
INTRAVENOUS | Status: DC
  Administered 2017-07-05 – 2017-07-06 (×3): via INTRAVENOUS

## 2017-07-05 MED ORDER — ASPIRIN EC 325 MG PO TBEC
325.0000 mg | DELAYED_RELEASE_TABLET | Freq: Two times a day (BID) | ORAL | Status: DC
  Administered 2017-07-06 – 2017-07-08 (×4): 325 mg via ORAL
  Filled 2017-07-05 (×4): qty 1

## 2017-07-05 MED ORDER — DOCUSATE SODIUM 100 MG PO CAPS: 100.0000 mg | ORAL_CAPSULE | Freq: Two times a day (BID) | ORAL | Status: DC

## 2017-07-05 MED ORDER — DONEPEZIL HCL 23 MG PO TABS: 23.0000 mg | ORAL_TABLET | Freq: Every day | ORAL | Status: DC

## 2017-07-05 MED ORDER — METHOCARBAMOL 500 MG PO TABS
500.0000 mg | ORAL_TABLET | Freq: Four times a day (QID) | ORAL | Status: DC | PRN
  Administered 2017-07-06 – 2017-07-07 (×3): 500 mg via ORAL
  Filled 2017-07-05 (×3): qty 1

## 2017-07-05 MED ORDER — BOOST PO LIQD
237.0000 mL | Freq: Two times a day (BID) | ORAL | Status: DC
  Administered 2017-07-06 – 2017-07-08 (×5): 237 mL via ORAL
  Filled 2017-07-05 (×10): qty 237

## 2017-07-05 MED ORDER — ACETAMINOPHEN 325 MG PO TABS: 650.0000 mg | ORAL_TABLET | Freq: Four times a day (QID) | ORAL | Status: DC | PRN

## 2017-07-05 MED ORDER — DEXAMETHASONE SODIUM PHOSPHATE 10 MG/ML IJ SOLN
INTRAMUSCULAR | Status: AC
  Filled 2017-07-05: qty 1

## 2017-07-05 SURGICAL SUPPLY — 48 items
BLADE SAW SGTL 18X1.27X75 (BLADE) ×2 IMPLANT
CAPT HIP HEMI 2 ×2 IMPLANT
COVER SURGICAL LIGHT HANDLE (MISCELLANEOUS) ×2 IMPLANT
DERMABOND ADHESIVE PROPEN (GAUZE/BANDAGES/DRESSINGS) ×1
DERMABOND ADVANCED (GAUZE/BANDAGES/DRESSINGS) ×1
DERMABOND ADVANCED .7 DNX12 (GAUZE/BANDAGES/DRESSINGS) ×1 IMPLANT
DERMABOND ADVANCED .7 DNX6 (GAUZE/BANDAGES/DRESSINGS) ×1 IMPLANT
DRAPE IMP U-DRAPE 54X76 (DRAPES) ×2 IMPLANT
DRAPE INCISE IOBAN 85X60 (DRAPES) ×2 IMPLANT
DRAPE ORTHO SPLIT 77X108 STRL (DRAPES) ×2
DRAPE SURG ORHT 6 SPLT 77X108 (DRAPES) ×2 IMPLANT
DRAPE U-SHAPE 47X51 STRL (DRAPES) ×2 IMPLANT
DRSG AQUACEL AG ADV 3.5X10 (GAUZE/BANDAGES/DRESSINGS) ×2 IMPLANT
DURAPREP 26ML APPLICATOR (WOUND CARE) ×2 IMPLANT
ELECT BLADE 4.0 EZ CLEAN MEGAD (MISCELLANEOUS) ×2
ELECT REM PT RETURN 9FT ADLT (ELECTROSURGICAL) ×2
ELECTRODE BLDE 4.0 EZ CLN MEGD (MISCELLANEOUS) ×1 IMPLANT
ELECTRODE REM PT RTRN 9FT ADLT (ELECTROSURGICAL) ×1 IMPLANT
EVACUATOR 1/8 PVC DRAIN (DRAIN) IMPLANT
FACESHIELD WRAPAROUND (MASK) ×4 IMPLANT
GLOVE BIOGEL PI IND STRL 7.5 (GLOVE) ×1 IMPLANT
GLOVE BIOGEL PI IND STRL 8.5 (GLOVE) ×2 IMPLANT
GLOVE BIOGEL PI INDICATOR 7.5 (GLOVE) ×1
GLOVE BIOGEL PI INDICATOR 8.5 (GLOVE) ×2
GLOVE ECLIPSE 8.0 STRL XLNG CF (GLOVE) ×2 IMPLANT
GLOVE ORTHO TXT STRL SZ7.5 (GLOVE) ×2 IMPLANT
GOWN STRL REUS W/ TWL LRG LVL3 (GOWN DISPOSABLE) ×3 IMPLANT
GOWN STRL REUS W/TWL 2XL LVL3 (GOWN DISPOSABLE) ×2 IMPLANT
GOWN STRL REUS W/TWL LRG LVL3 (GOWN DISPOSABLE) ×3
HANDPIECE INTERPULSE COAX TIP (DISPOSABLE)
IMMOBILIZER KNEE 22 UNIV (SOFTGOODS) ×2 IMPLANT
KIT BASIN OR (CUSTOM PROCEDURE TRAY) ×2 IMPLANT
KIT TURNOVER KIT B (KITS) ×2 IMPLANT
MANIFOLD NEPTUNE II (INSTRUMENTS) ×2 IMPLANT
NS IRRIG 1000ML POUR BTL (IV SOLUTION) ×2 IMPLANT
PACK TOTAL JOINT (CUSTOM PROCEDURE TRAY) ×2 IMPLANT
PACK UNIVERSAL I (CUSTOM PROCEDURE TRAY) ×2 IMPLANT
PAD ARMBOARD 7.5X6 YLW CONV (MISCELLANEOUS) ×4 IMPLANT
SET HNDPC FAN SPRY TIP SCT (DISPOSABLE) IMPLANT
SPONGE LAP 4X18 X RAY DECT (DISPOSABLE) ×4 IMPLANT
SUT MNCRL AB 4-0 PS2 18 (SUTURE) IMPLANT
SUT VIC AB 1 CT1 27 (SUTURE) ×2
SUT VIC AB 1 CT1 27XBRD ANBCTR (SUTURE) ×2 IMPLANT
SUT VIC AB 2-0 CT1 27 (SUTURE)
SUT VIC AB 2-0 CT1 TAPERPNT 27 (SUTURE) IMPLANT
TOWEL OR 17X26 10 PK STRL BLUE (TOWEL DISPOSABLE) ×2 IMPLANT
TRAY FOLEY W/BAG SLVR 14FR (SET/KITS/TRAYS/PACK) IMPLANT
WATER STERILE IRR 1000ML POUR (IV SOLUTION) ×8 IMPLANT

## 2017-07-05 NOTE — Anesthesia Procedure Notes (Signed)
Procedure Name: Intubation Date/Time: 07/05/2017 5:08 PM Performed by: Moshe Salisbury, CRNA Pre-anesthesia Checklist: Patient identified, Emergency Drugs available, Suction available and Patient being monitored Patient Re-evaluated:Patient Re-evaluated prior to induction Oxygen Delivery Method: Circle System Utilized Preoxygenation: Pre-oxygenation with 100% oxygen Induction Type: IV induction Ventilation: Mask ventilation without difficulty Laryngoscope Size: Mac and 3 Grade View: Grade II Tube type: Oral Tube size: 7.5 mm Number of attempts: 1 Airway Equipment and Method: Stylet Placement Confirmation: ETT inserted through vocal cords under direct vision,  positive ETCO2 and breath sounds checked- equal and bilateral Secured at: 21 cm Tube secured with: Tape Dental Injury: Teeth and Oropharynx as per pre-operative assessment

## 2017-07-05 NOTE — Anesthesia Preprocedure Evaluation (Addendum)
Anesthesia Evaluation  Patient identified by MRN, date of birth, ID band Patient confused    Reviewed: Allergy & Precautions, NPO status , Patient's Chart, lab work & pertinent test results  Airway Mallampati: II  TM Distance: >3 FB Neck ROM: Full    Dental  (+) Dental Advisory Given   Pulmonary neg pulmonary ROS,    breath sounds clear to auscultation       Cardiovascular hypertension,  Rhythm:Regular Rate:Normal     Neuro/Psych dementia TIA   GI/Hepatic negative GI ROS, Neg liver ROS,   Endo/Other  negative endocrine ROS  Renal/GU negative Renal ROS     Musculoskeletal   Abdominal   Peds  Hematology  (+) anemia ,   Anesthesia Other Findings   Reproductive/Obstetrics                             Lab Results  Component Value Date   WBC 8.5 07/05/2017   HGB 12.5 07/05/2017   HCT 38.4 07/05/2017   MCV 91.6 07/05/2017   PLT 120 (L) 07/05/2017   Lab Results  Component Value Date   CREATININE 0.80 07/05/2017   BUN 16 07/05/2017   NA 141 07/05/2017   K 3.4 (L) 07/05/2017   CL 107 07/05/2017   CO2 24 07/05/2017    Anesthesia Physical Anesthesia Plan  ASA: III  Anesthesia Plan: General   Post-op Pain Management:    Induction: Intravenous  PONV Risk Score and Plan: Ondansetron, Dexamethasone and Treatment may vary due to age or medical condition  Airway Management Planned: Oral ETT  Additional Equipment:   Intra-op Plan:   Post-operative Plan: Extubation in OR  Informed Consent: I have reviewed the patients History and Physical, chart, labs and discussed the procedure including the risks, benefits and alternatives for the proposed anesthesia with the patient or authorized representative who has indicated his/her understanding and acceptance.   Dental advisory given  Plan Discussed with: CRNA  Anesthesia Plan Comments:        Anesthesia Quick Evaluation

## 2017-07-05 NOTE — Progress Notes (Addendum)
PROGRESS NOTE  Michelle Savage AVW:[id number] DOB: Jun 16, 1924 DOA: 07/04/2017 PCP: [name], MD  Brief Narrative: 42yow PMH advanced dementia with behavioral disturbance presented with leg pain s/p fall at [facility]. Admitted for left hip fx.  Assessment/Plan Left femoral neck fx s/p fall --management per orthopedics. --RCRI class I risk   3.9% 30-day risk death, MI, cardiac arrest.  Pt unable to provide hx but no h/o ischemic heart disease, no report of active angina, rhythm disturbances, active fluid retention/heart failure that requires optimization pre-operatively.  . ECG: sinus arrhythmia, prolonged QT . Echo 2012 nl LVEF, grade 1 diastolic dysfunction, mild aortic, mitral regur, mild-mod tricuspid regurgitation. Marland Kitchen Co-morbidities: dementia  . No further testing prior to surgery indicated to optimize except repeat EKG to assess QT. Discussed with daughter, understands and accepts, agrees with no further workup. . Maintain net even fluids. avoid hypotension intraoperatively with anesthesia as able   . Addendum: Follow-up EKG shows sinus tach with PACs.  QT has decreased, now near normal.  Magnesium within normal limits.  Thrombocytopenia --recent start on olanzapine but plts better today. Follow clinically  Prolonged QT --new since 04/2017 , probably olanzepine give recent start --d/c sertraline, ondansetron, olanzepine, Aricept --repeat EKG --replete potassium and check Mg2+  Hypokalemia --replete   Alzheimer's dementia  --at baseline pt is aggressive and hyperverbal --continue Aricept, Abilify. Hold Zoloft as above.  DVT prophylaxis: SCDs preop Code Status: DNR Family Communication: daughter at bedside  Disposition Plan: likely SNF    Brendia Sacks, MD  [facility] Direct contact: [phone] --Via amion app OR  --[email or web address]; password [email or web address]  7PM-7AM contact night coverage as above 07/05/2017, 9:08 AM  LOS: 1 day    Consultants:  Orthopedics   Procedures:    Antimicrobials:    Interval history/Subjective: CONFUSED, no hx can be obtained from pt. Daughter at bedside. Pt with advanced dementia with behavioral disturbance, belligerence daily, uncooperative. Several falls at night none during day. Walks without a Youth worker.   Objective: Vitals:  Vitals:   07/05/17 0345 07/05/17 0809  BP: (!) 155/60 140/79  Pulse:  75  Resp:  20  Temp:    SpO2:  91%    Exam:  Constitutional:  . Appears calm and comfortable, but stirs easily and calls out intermittently Eyes:  . pupils and irises appear normal . Normal lids   ENMT:  . Cannot assess hearing . Lips appear normal Respiratory:  . CTA bilaterally, no w/r/r.  . Respiratory effort normal.  Cardiovascular:  . RRR, no m/r/g . No LE extremity edema   . DP pulse 2+ bilaterally Abdomen:  . Soft, ntnd Musculoskeletal:  . RLE, LLE   . Appear grossly unremarkalbe Skin:  . No rashes, lesions, ulcers . palpation of skin: no induration or nodules Psychiatric:  . Mental status o Mood, affect not assessable o Confused at baseline . judgment and insight appearimpaired   I have personally reviewed the following:   Labs:  Plts 82 >> 120  K+ 3.4    Imaging studies:  CXR NAD  Medical tests:  EKG sinus arrhythmia, prolonged QT   Scheduled Meds: . docusate sodium  100 mg Oral BID  . donepezil  23 mg Oral QHS  . [START ON 07/06/2017] enoxaparin (LOVENOX) injection  40 mg Subcutaneous Q24H  . lactose free nutrition  237 mL Oral BID BM  . memantine  10 mg Oral BID  . OLANZapine  2.5 mg Oral Daily  . sertraline  100 mg Oral Daily  Continuous Infusions: . sodium chloride 1,000 mL (07/05/17 0429)    Principal Problem:   Closed left hip fracture, initial encounter [facility]) Active Problems:   Essential hypertension   Alzheimer's dementia   DNR (do not resuscitate)   Weakness generalized   Fall at home, initial  encounter   LOS: 1 day

## 2017-07-05 NOTE — Plan of Care (Signed)
Will reinforce through the day

## 2017-07-05 NOTE — Transfer of Care (Signed)
Immediate Anesthesia Transfer of Care Note  Patient: Michelle Savage  Procedure(s) Performed: ARTHROPLASTY BIPOLAR HIP (HEMIARTHROPLASTY) (Left Hip)  Patient Location: PACU  Anesthesia Type:General  Level of Consciousness: drowsy and patient cooperative  Airway & Oxygen Therapy: Patient Spontanous Breathing and Patient connected to nasal cannula oxygen  Post-op Assessment: Report given to RN, Post -op Vital signs reviewed and stable and Patient moving all extremities  Post vital signs: Reviewed and stable  Last Vitals:  Vitals Value Taken Time  BP 134/68 07/05/2017  6:35 PM  Temp    Pulse 62 07/05/2017  6:36 PM  Resp 13 07/05/2017  6:36 PM  SpO2 98 % 07/05/2017  6:36 PM  Vitals shown include unvalidated device data.  Last Pain:  Vitals:   07/05/17 1532  TempSrc: Oral  PainSc:          Complications: No apparent anesthesia complications

## 2017-07-05 NOTE — Progress Notes (Signed)
Betadine swab completed

## 2017-07-05 NOTE — ED Triage Notes (Signed)
Attempted report to 5N. °

## 2017-07-05 NOTE — Progress Notes (Signed)
Spoke with Dr. Glade Stanford. Was told not to finish the potassium runs after patient had already received two of the four runs ordered.

## 2017-07-05 NOTE — Consult Note (Signed)
Reason for Consult:Left hip fx Referring Physician: [name] is an 84 y.o. female.  HPI: Michelle Savage suffered a fall at the facility where she lives. She was brought to the hospital where x-rays showed a femoral neck fx and orthopedic surgery was consulted. She is quite demented and cannot contribute to history or much to PE though she would follow some commands. History obtained from dtr in the room.  Past Medical History:  Diagnosis Date  . Alzheimer disease   . Anemia   . Aortic insufficiency   . Depression   . HOH (hard of hearing)   . Hypertension   . Memory loss   . Mitral valve regurgitation   . Vitamin Michelle deficiency     Past Surgical History:  Procedure Laterality Date  . CHOLECYSTECTOMY    . TONSILLECTOMY      Family History  Problem Relation Age of Onset  . Lung cancer Mother   . Dementia Sister   . Stroke Sister     Social History:  reports that she has never smoked. She has never used smokeless tobacco. She reports that she does not drink alcohol or use drugs.  Allergies:  Allergies  Allergen Reactions  . Penicillins Rash    Has patient had a PCN reaction causing immediate rash, facial/tongue/throat swelling, SOB or lightheadedness with hypotension: Yes Has patient had a PCN reaction causing severe rash involving mucus membranes or skin necrosis: No Has patient had a PCN reaction that required hospitalization: Unknown Has patient had a PCN reaction occurring within the last 10 years: No If all of the above answers are "NO", then may proceed with Cephalosporin use.  . Ativan [Lorazepam]     Agitation     Medications: I have reviewed the patient's current medications.  Results for orders placed or performed during the hospital encounter of 07/04/17 (from the past 48 hour(s))  Basic metabolic panel     Status: Abnormal   Collection Time: 07/04/17 10:10 PM  Result Value Ref Range   Sodium 143 135 - 145 mmol/L   Potassium 3.8 3.5 - 5.1  mmol/L   Chloride 106 101 - 111 mmol/L   CO2 27 22 - 32 mmol/L   Glucose, Bld 111 (H) 65 - 99 mg/dL   BUN 16 6 - 20 mg/dL   Creatinine, Ser 0.86 0.44 - 1.00 mg/dL   Calcium 9.2 8.9 - 10.3 mg/dL   GFR calc non Af Amer 57 (L) >60 mL/min   GFR calc Af Amer >60 >60 mL/min    Comment: (NOTE) The eGFR has been calculated using the CKD EPI equation. This calculation has not been validated in all clinical situations. eGFR's persistently <60 mL/min signify possible Chronic Kidney Disease.    Anion gap 10 5 - 15    Comment: Performed at [facility] [address] [place], [place] [address]  CBC WITH DIFFERENTIAL     Status: Abnormal   Collection Time: 07/04/17 10:10 PM  Result Value Ref Range   WBC 9.5 4.0 - 10.5 K/uL   RBC 4.97 3.87 - 5.11 MIL/uL   Hemoglobin 14.5 12.0 - 15.0 g/dL   HCT 45.5 36.0 - 46.0 %   MCV 91.5 78.0 - 100.0 fL   MCH 29.2 26.0 - 34.0 pg   MCHC 31.9 30.0 - 36.0 g/dL   RDW 14.4 11.5 - 15.5 %   Platelets 82 (L) 150 - 400 K/uL    Comment: REPEATED TO VERIFY SPECIMEN CHECKED  FOR CLOTS PLATELET COUNT CONFIRMED BY SMEAR    Neutrophils Relative % 86 %   Neutro Abs 8.2 (H) 1.7 - 7.7 K/uL   Lymphocytes Relative 8 %   Lymphs Abs 0.7 0.7 - 4.0 K/uL   Monocytes Relative 6 %   Monocytes Absolute 0.6 0.1 - 1.0 K/uL   Eosinophils Relative 0 %   Eosinophils Absolute 0.0 0.0 - 0.7 K/uL   Basophils Relative 0 %   Basophils Absolute 0.0 0.0 - 0.1 K/uL   Immature Granulocytes 0 %   Abs Immature Granulocytes 0.0 0.0 - 0.1 K/uL    Comment: Performed at [facility] [address], [place], [place] [address]  Protime-INR     Status: None   Collection Time: 07/04/17 10:10 PM  Result Value Ref Range   Prothrombin Time 13.8 11.4 - 15.2 seconds   INR 1.07     Comment: Performed at [facility] [address] [place], [place] [address]  Type and screen [place]     Status: None   Collection Time: 07/04/17 10:34 PM  Result Value Ref  Range   ABO/RH(Michelle) B POS    Antibody Screen NEG    Sample Expiration      07/07/2017 Performed at [facility], [place] [address] [place], [place] [address]   ABO/Rh     Status: None   Collection Time: 07/04/17 10:34 PM  Result Value Ref Range   ABO/RH(Michelle)      B POS Performed at [facility] [address] [place], [place] [address]   Comprehensive metabolic panel     Status: Abnormal   Collection Time: 07/05/17  4:15 AM  Result Value Ref Range   Sodium 141 135 - 145 mmol/L   Potassium 3.4 (L) 3.5 - 5.1 mmol/L   Chloride 107 101 - 111 mmol/L   CO2 24 22 - 32 mmol/L   Glucose, Bld 99 65 - 99 mg/dL   BUN 16 6 - 20 mg/dL   Creatinine, Ser 0.80 0.44 - 1.00 mg/dL   Calcium 8.5 (L) 8.9 - 10.3 mg/dL   Total Protein 6.0 (L) 6.5 - 8.1 g/dL   Albumin 2.8 (L) 3.5 - 5.0 g/dL   AST 23 15 - 41 U/L   ALT 16 14 - 54 U/L   Alkaline Phosphatase 67 38 - 126 U/L   Total Bilirubin 1.0 0.3 - 1.2 mg/dL   GFR calc non Af Amer >60 >60 mL/min   GFR calc Af Amer >60 >60 mL/min    Comment: (NOTE) The eGFR has been calculated using the CKD EPI equation. This calculation has not been validated in all clinical situations. eGFR's persistently <60 mL/min signify possible Chronic Kidney Disease.    Anion gap 10 5 - 15    Comment: Performed at [facility] [address], [place] [address]  CBC     Status: Abnormal   Collection Time: 07/05/17  4:15 AM  Result Value Ref Range   WBC 8.5 4.0 - 10.5 K/uL   RBC 4.19 3.87 - 5.11 MIL/uL   Hemoglobin 12.5 12.0 - 15.0 g/dL   HCT 38.4 36.0 - 46.0 %   MCV 91.6 78.0 - 100.0 fL   MCH 29.8 26.0 - 34.0 pg   MCHC 32.6 30.0 - 36.0 g/dL   RDW 14.4 11.5 - 15.5 %   Platelets 120 (L) 150 - 400 K/uL    Comment: Performed at [facility]  [facility], [place] [address] [place], [place] [address]  Magnesium     Status: None   Collection Time: 07/05/17  4:15 AM  Result Value Ref Range   Magnesium 2.1 1.7 - 2.4 mg/dL    Comment: Performed at [facility] [address], [place], [place] [address]    Dg Chest 1 View  Result Date: 07/04/2017 CLINICAL DATA:  Preop EXAM: CHEST  1 VIEW COMPARISON:  04/27/2017 FINDINGS: Cardiomegaly. No confluent airspace opacities, effusions or edema. No acute bony abnormality. IMPRESSION: Cardiomegaly.  No active disease. Electronically Signed   By: Rolm Baptise M.Michelle.   On: 07/04/2017 23:18   Dg Hip Unilat With Pelvis 2-3 Views Left  Result Date: 07/04/2017 CLINICAL DATA:  Fall EXAM: DG HIP (WITH OR WITHOUT PELVIS) 2-3V LEFT COMPARISON:  None. FINDINGS: There is a left femoral neck fracture with impaction and varus angulation. Mild degenerative changes in the hips bilaterally. No subluxation or dislocation. IMPRESSION: Left femoral neck fracture with impaction and varus angulation. Electronically Signed   By: Rolm Baptise M.Michelle.   On: 07/04/2017 23:19    Review of Systems  Unable to perform ROS: Dementia   Blood pressure (!) 163/97, pulse (!) 111, temperature 100 F (37.8 C), temperature source Oral, resp. rate 17, height 5' 2"  (1.575 m), weight 56.7 kg (125 lb), SpO2 94 %. Physical Exam  Constitutional: She appears well-developed and well-nourished. No distress.  HENT:  Head: Normocephalic and atraumatic.  Eyes: Conjunctivae are normal. Right eye exhibits no discharge. Left eye exhibits no discharge. No scleral icterus.  Neck: Normal range of motion.  Cardiovascular: Normal rate and regular rhythm.  Respiratory: Effort normal. No respiratory distress.  Musculoskeletal:  LLE No traumatic wounds, ecchymosis, or rash  Not well reproducible TTP hip  No knee or ankle effusion  Knee stable to varus/ valgus and anterior/posterior stress  Sens DPN, SPN, TN could not assess  Motor EHL, ext, flex, evers could not assess  DP 2+, PT 2+, No significant edema  Neurological: She is alert.  Skin: Skin is warm and dry. She is not diaphoretic.  Psychiatric: She is agitated. Thought content is delusional.     Assessment/Plan: Fall Left femoral neck fx -- Will need hip hemi this afternoon by Dr. Alvan Dame. Please keep NPO until then. Multiple medical problems including Alzheimer's, prolonged QT syndrome, hypokalemia -- per primary service    Lisette Abu, PA-C Orthopedic Surgery [phone] 07/05/2017, 1:10 PM

## 2017-07-05 NOTE — Progress Notes (Addendum)
Initial Nutrition Assessment  DOCUMENTATION CODES:   Not applicable  INTERVENTION:    Advance diet as medically appropriate. RD to add supplements when/as able.  NUTRITION DIAGNOSIS:   Increased nutrient needs related to hip fracture as evidenced by estimated needs  GOAL:   Patient will meet greater than or equal to 90% of their needs  MONITOR:   Diet advancement, PO intake, Supplement acceptance, Labs, Skin, Weight trends, I & O's  REASON FOR ASSESSMENT:   Consult Hip fracture protocol  ASSESSMENT:   84 y.o. Female with PMH significant for advanced dementia, hard of hearing, hypertension, valvular heart disease; presented to the hospital for evaluation of left hip fracture.   Pt admitted from [facility]. RD briefly spoke with pt's daughter. Pt confused and restless. Daughter reports pt's PO intake has improved since started on Zyprexa.  Pt enjoys chocolate Boost Plus nutrition supplements. Receives at [facility]. Per readings below, pt's weight has been stable since 03/2017. Labs and medications reviewed. K 3.4 (L).  NUTRITION - FOCUSED PHYSICAL EXAM:  Unable to complete at this time.  Diet Order:   Diet Order           Diet NPO time specified Except for: Sips with Meds  Diet effective midnight         EDUCATION NEEDS:   No education needs have been identified at this time  Skin:  Skin Assessment: Reviewed RN Assessment  Last BM:  N/A  Height:   Ht Readings from Last 1 Encounters:  07/04/17  (1.575 m)   Weight:   Wt Readings from Last 1 Encounters:  07/04/17 125 lb (56.7 kg)   Wt Readings from Last 10 Encounters:  07/04/17 125 lb (56.7 kg)  04/27/17 123 lb 10.9 oz (56.1 kg)  03/22/17 128 lb (58.1 kg)  03/19/16 141 lb 12 oz (64.3 kg)  04/16/15 147 lb 8 oz (66.9 kg)  10/11/14 142 lb (64.4 kg)  03/19/14 137 lb (62.1 kg)  04/27/13 144 lb (65.3 kg)  10/07/12 136 lb (61.7 kg)  03/17/11 151 lb (68.5 kg)   Ideal Body Weight:  56.8 kg  BMI:   Body mass index is 22.86 kg/m.  Estimated Nutritional Needs:   Kcal:  1200-1400  Protein:  60-75 gm  Fluid:  >/= 1.5 L  Maureen Chatters, RD, LDN Pager #: [phone] After-Hours Pager #: [phone]

## 2017-07-05 NOTE — Anesthesia Postprocedure Evaluation (Signed)
Anesthesia Post Note  Patient: Michelle Savage  Procedure(s) Performed: ARTHROPLASTY BIPOLAR HIP (HEMIARTHROPLASTY) (Left Hip)     Patient location during evaluation: PACU Anesthesia Type: General Level of consciousness: confused and awake Pain management: pain level controlled Vital Signs Assessment: post-procedure vital signs reviewed and stable Respiratory status: spontaneous breathing, nonlabored ventilation, respiratory function stable and patient connected to nasal cannula oxygen Cardiovascular status: blood pressure returned to baseline and stable Postop Assessment: no apparent nausea or vomiting Anesthetic complications: no    Last Vitals:  Vitals:   07/05/17 1950 07/05/17 2007  BP: 107/80 (!) 137/55  Pulse: 89 85  Resp: 15   Temp: 36.4 C   SpO2: 97% 96%    Last Pain:  Vitals:   07/05/17 1532  TempSrc: Oral  PainSc:                  Carrye Goller COKER

## 2017-07-05 NOTE — Op Note (Signed)
NAME:  Michelle Savage, Michelle Savage                ACCOUNT NO.:  [id number]   MEDICAL RECORD NO.: [id number]   LOCATION:  1435                         FACILITY:  [facility]   DATE OF BIRTH:  01/02/1925  PHYSICIAN:  Madlyn Frankel. Charlann Boxer, M.D.     DATE OF PROCEDURE:  07/05/17                               OPERATIVE REPORT     PREOPERATIVE DIAGNOSIS:  Left displaced femoral neck fracture.   POSTOPERATIVE DIAGNOSIS:  Left displaced femoral neck fracture.   PROCEDURE:  Left hip hemiarthroplasty utilizing DePuy component, size 4 Hi Tri-Lock stem with a 45mm unipolar ball with a -3 adapter.   SURGEON:  Madlyn Frankel. Charlann Boxer, MD   ASSISTANT:  Lanney Gins, PA-C.   ANESTHESIA:  General.   SPECIMENS:  None.   DRAINS:  None.   BLOOD LOSS:  About 75 cc.   COMPLICATIONS:  None.   INDICATION OF PROCEDURE:  [name] is a 84 year old female with advanced dementia who fell at her place of residence.  She had immediate pain and inability to bear weight.  She was admitted to the hospital after radiographs revealed a femoral neck fracture.  She was seen and evaluated and was scheduled for surgery for fixation.  The necessity of surgical repair was discussed with her daughter and POA.  Consent was obtained after reviewing risks of infection, DVT, component failure, and need for revision surgery.   PROCEDURE IN DETAIL:  The patient was brought to the operative theater. Once adequate anesthesia, preoperative antibiotics,  of Clindamycin administered, the patient was positioned into the right lateral decubitus position with the left side up.  The left lower extremity was then prepped and draped in sterile fashion.  A time-out was performed identifying the patient, planned procedure, and extremity.   A lateral incision was made off the proximal trochanter. Sharp dissection was carried down to the iliotibial band and gluteal fascia. The gluteal fascia was then incised for posterior approach.  The short external  rotators were taken down separate from the posterior capsule. An L capsulotomy was made preserving the posterior leaflet for later anatomic repair. Fracture site was identified and after removing comminuted segments of the posterior femoral neck, the femoral head was removed without difficulty and measured on the back table  using the sizing rings and determined to be 45 mm in diameter.   The proximal femur was then exposed.  Retractors placed.  I then drilled, opened the proximal femur.  Then I hand reamed once and  Irrigated the canal to try to prevent fat emboli.  I began broaching the femur with a starter broach up to a size 4 broach with good medial and lateral metaphyseal fit without evidence of any torsion or movement.  A trial reduction was carried out with a high offset neck and a -3 adapter with a 45mm ball.  The hip reduced nicely.  The leg lengths appeared to be equal compared to the down leg and this construct seem to give the best stability to her hip particularly concerns for non-compliance post-operatively.   The hip went through a range of motion without evidence of any subluxation or impingement.   Given these findings,  the trial components removed.  The final 4 Hi  Tri-Lock stem was opened.  After irrigating the canal, the final stem was impacted and sat at the level where the broach was. Based on this and the trial reduction, a -3 adapter was opened and impacted in the 45mm unipolar ball onto a clean and dry trunnion.  The hip had been irrigated throughout the case and again at this point.  I re- Approximated the posterior capsule to the superior leaflet using a  #1 Vicryl.  The remainder of the wound was closed with #1 Vicryl in the iliotibial band and gluteal fascia, a  2-0 Vicryl in the sub-Q tissue and a running 4-0 Monocryl in the skin.  The hip was cleaned, dried, and dressed sterilely using Dermabond and Aquacel dressing.  She was then brought to recovery room,  extubated in stable condition, tolerating the procedure well.  Lanney Gins, PA-C was present and utilized as Geophysicist/field seismologist for the case from management of the operative extremity and retractors to  General facilitation of the procedure.  He was also involved with primary wound closure.         Madlyn Frankel Charlann Boxer, M.D.

## 2017-07-05 NOTE — Progress Notes (Signed)
9 rings total removed from pt's hands and bilateral hearing aids removed.  [name] from [facility]N here in short stay to take belongings back to floor.

## 2017-07-06 ENCOUNTER — Encounter (HOSPITAL_COMMUNITY): Payer: Self-pay | Admitting: Orthopedic Surgery

## 2017-07-06 LAB — BASIC METABOLIC PANEL
Anion gap: 8 (ref 5–15)
BUN: 23 mg/dL — AB (ref 6–20)
CHLORIDE: 108 mmol/L (ref 101–111)
CO2: 24 mmol/L (ref 22–32)
Calcium: 8.6 mg/dL — ABNORMAL LOW (ref 8.9–10.3)
Creatinine, Ser: 0.85 mg/dL (ref 0.44–1.00)
GFR calc Af Amer: >60 mL/min (ref >60)
GFR calc non Af Amer: 58 mL/min — ABNORMAL LOW (ref >60)
GLUCOSE: 96 mg/dL (ref 65–99)
POTASSIUM: 4.2 mmol/L (ref 3.5–5.1)
Sodium: 140 mmol/L (ref 135–145)

## 2017-07-06 LAB — CBC
HEMATOCRIT: 40.4 % (ref 36.0–46.0)
Hemoglobin: 12.9 g/dL (ref 12.0–15.0)
MCH: 30.1 pg (ref 26.0–34.0)
MCHC: 31.9 g/dL (ref 30.0–36.0)
MCV: 94.2 fL (ref 78.0–100.0)
Platelets: 129 10*3/uL — ABNORMAL LOW (ref 150–400)
RBC: 4.29 MIL/uL (ref 3.87–5.11)
RDW: 14.6 % (ref 11.5–15.5)
WBC: 7.2 10*3/uL (ref 4.0–10.5)

## 2017-07-06 MED ORDER — LORAZEPAM 0.5 MG PO TABS
0.5000 mg | ORAL_TABLET | Freq: Once | ORAL | Status: DC
  Filled 2017-07-06: qty 1

## 2017-07-06 MED ORDER — LORAZEPAM 0.5 MG PO TABS
0.5000 mg | ORAL_TABLET | Freq: Two times a day (BID) | ORAL | Status: DC | PRN
  Administered 2017-07-06 – 2017-07-07 (×3): 0.5 mg via ORAL
  Filled 2017-07-06 (×3): qty 1

## 2017-07-06 NOTE — Progress Notes (Signed)
Patient ID: Michelle Savage, female   DOB: 11-23-1924, 84 y.o.   MRN: [id number]   LOS: 2 days   POD#1  Subjective: Demented, dtr notes pt in much better mood, seemingly comfortable, eating for first time since fall.   Objective: Vital signs in last 24 hours: Temp:  [97 F (36.1 C)-98.6 F (37 C)] 97.6 F (36.4 C) (05/20 2007) Pulse Rate:  [75-115] 76 (05/21 0613) Resp:  [10-17] 17 (05/21 4098) BP: (105-153)/(50-97) 135/67 (05/21 0613) SpO2:  [93 %-97 %] 93 % (05/21 1191) Weight:  [56.7 kg (125 lb)] 56.7 kg (125 lb) (05/20 1557)     Laboratory  CBC Recent Labs    07/05/17 0415 07/06/17 0736  WBC 8.5 7.2  HGB 12.5 12.9  HCT 38.4 40.4  PLT 120* 129*   BMET Recent Labs    07/05/17 0415 07/06/17 0736  NA 141 140  K 3.4* 4.2  CL 107 108  CO2 24 24  GLUCOSE 99 96  BUN 16 23*  CREATININE 0.80 0.85  CALCIUM 8.5* 8.6*     Physical Exam General appearance: alert and no distress  LLE: Incision C/D/I w/o erythema or discharge, foot warm, gross motion intact   Assessment/Plan: Left hip fx s/p hemi -- OOB with PT/OT. Ok to discharge to SNF from orthopedic standpoint. F/u with Dr. Charlann Boxer as OP.    Freeman Caldron, PA-C Orthopedic Surgery [phone] 07/06/2017

## 2017-07-06 NOTE — Clinical Social Work Note (Signed)
Clinical Social Work Assessment  Patient Details  Name: Michelle Savage MRN: [id number] Date of Birth: August 02, 1924  Date of referral:  07/06/17               Reason for consult:  Facility Placement                Permission sought to share information with:  Chartered certified accountant granted to share information::  Yes, Verbal Permission Granted  Name::     Surveyor, mining::  SNF  Relationship::  daughter  Contact Information:     Housing/Transportation Living arrangements for the past 2 months:  [place] of Information:  Adult Children Patient Interpreter Needed:  None Criminal Activity/Legal Involvement Pertinent to Current Situation/Hospitalization:  No - Comment as needed Significant Relationships:  Adult Children, Other Family Members Lives with:  Facility Resident Do you feel safe going back to the place where you live?  No Need for family participation in patient care:  Yes (Comment)  Care giving concerns:  Pt from nursing facility and will need skilled nursing at discharge.  Social Worker assessment / plan:  CSW met with patient at bedside along with daughter Michelle Savage to discuss the placement plans. Daughter indicated that patient resides at [facility] and may need skilled nursing.  Family desires SNF-[facility]. CSW explained the SNF process, placement and Insurance process. CSW obtained permission to send to SNF's in [place] at appropriate time.   PT is pending. Passr is pending.  CSW will f/u for disposition at appropriate time.  Employment status:  Retired Nurse, adult PT Recommendations:  [place] / Referral to community resources:  [place]  Patient/Family's Response to care:  Family thanked CSW for meeting to discuss disposition. Family agreeable to SNF at discharge.  Patient/Family's Understanding of and Emotional  Response to Diagnosis, Current Treatment, and Prognosis:  Family has good understanding of impairment and agreeable to SNF at discharge.  Daughter indicated there at not good SNF's, then they may have patient return to [facility] with home care in the evening if warranted. CSW validated and will f/u for disposition.  Emotional Assessment Appearance:  Appears stated age Attitude/Demeanor/Rapport:  (Confused) Affect (typically observed):  Appropriate Orientation:  Oriented to Self Alcohol / Substance use:  Not Applicable Psych involvement (Current and /or in the community):  No (Comment)  Discharge Needs  Concerns to be addressed:  Discharge Planning Concerns Readmission within the last 30 days:  No Current discharge risk:  Dependent with Mobility, Physical Impairment Barriers to Discharge:  No Barriers Identified   Michelle Baxter, LCSW 07/06/2017, 1:24 PM

## 2017-07-06 NOTE — Progress Notes (Signed)
  PROGRESS NOTE  Michelle Savage WGN:[id number] DOB: 05-08-1924 DOA: 07/04/2017 PCP: [name], MD  Brief Narrative: 88yow PMH advanced dementia with behavioral disturbance presented with leg pain s/p fall at [facility]. Admitted for left hip fx. Underwent left hip hemiarthroplasty 5/20 without apparent complication.  Assessment/Plan Left femoral neck fx s/p fall --management per orthopedics including pain and DVT prophylaxis.  Thrombocytopenia --Platelets trending back upwards.  Etiology unclear. --Check CBC in a.m.  Prolonged QT --new since 04/2017 , probably olanzepine give recent start. --sertraline, ondansetron, olanzepine, Aricept were discontinued. --repeat EKG 5/20 showed decreased QT interval.  Potassium was repleted, magnesium was within normal limits. --Will repeat EKG in a.m. to reassess QT.  Could consider restarting olanzapine and following closely in the outpatient setting.  Given patient's advanced dementia, doubt Aricept of much benefit the patient, given its propensity for cardiac arrhythmias, would discontinue this medication. --Can probably restart sertraline tomorrow  Alzheimer's dementia  --at baseline pt is aggressive and hyperverbal --Ativan as needed.  Continue Namenda.  Otherwise plan as above under prolonged QT.  DVT prophylaxis: SCDs preop Code Status: DNR Family Communication: daughter at bedside 5/21 Disposition Plan: likely SNF    Brendia Sacks, MD  [facility] Direct contact: [phone] --Via amion app OR  --[email or web address]; password [email or web address]  7PM-7AM contact night coverage as above 07/06/2017, 2:02 PM  LOS: 2 days   Consultants:  Orthopedics   Procedures:  5/20 Left hip hemiarthroplasty utilizing DePuy component, size 4 Hi Tri-Lock stem with a 45mm unipolar ball with a -3 adapter.  Antimicrobials:    Interval history/Subjective: Remains confused.  Daughter reports a difficult night.  Objective: Vitals:  Vitals:     07/06/17 0100 07/06/17 0613  BP: (!) 153/50 135/67  Pulse: 75 76  Resp:  17  Temp:    SpO2:  93%    Exam:  Constitutional:   . Appears calm at times, anxious at other times.  Restless at times. Respiratory:  . CTA bilaterally, no w/r/r.  . Respiratory effort normal.  Cardiovascular:  . RRR, no m/r/g Psychiatric:  . Mental status o Mood, affect appropriate . Confused.  I have personally reviewed the following:   Labs:  Plts 82 >> 120 >> 129  Hemoglobin stable, 12.9.  Potassium within normal limits.  BMP unremarkable.  Scheduled Meds: . aspirin EC  325 mg Oral BID  . celecoxib  200 mg Oral BID  . docusate sodium  100 mg Oral BID  . ferrous sulfate  325 mg Oral TID PC  . lactose free nutrition  237 mL Oral BID BM  . memantine  10 mg Oral BID   Continuous Infusions: . lactated ringers 125 mL/hr at 07/06/17 0840  . lactated ringers 10 mL/hr at 07/05/17 1559  . methocarbamol (ROBAXIN)  IV      Principal Problem:   Closed left hip fracture, initial encounter [facility]) Active Problems:   Essential hypertension   Alzheimer's dementia   DNR (do not resuscitate)   Weakness generalized   Fall at home, initial encounter   Thrombocytopenia (HCC)   Prolonged QT interval   LOS: 2 days

## 2017-07-06 NOTE — Social Work (Signed)
CSW awaiting therapy evaluation to complete Fl2 and send out to SNF's for offers.  PASSR pending.  CSW will f/u.  Keene Breath, LCSW Clinical Social Worker [phone]

## 2017-07-06 NOTE — Evaluation (Signed)
Physical Therapy Evaluation Patient Details Name: Michelle Savage MRN: [id number] DOB: 01-08-1925 Today's Date: 07/06/2017   History of Present Illness  Pt fell at her facility sustaining L hip fx, underwent THA by Dr. Charlann Boxer. PMH: HTN, advanced dementia  Clinical Impression  Pt admitted with above diagnosis. Pt currently with functional limitations due to the deficits listed below (see PT Problem List). Pt very difficult to work with due to cognitive deficits. Quickly combative for no apparent reason and just as quickly settles down. Seems especially sensitive to loud noises, eg bed alarm. Assisted pt to sitting EOB, required max A. Maintained for 10 mins with min A before requesting to return to supine. Attempted there ex but pt unable to attend to commands well enough at this point to follow.    Pt will benefit from skilled PT to increase their independence and safety with mobility to allow discharge to the venue listed below.       Follow Up Recommendations SNF;Supervision/Assistance - 24 hour    Equipment Recommendations  None recommended by PT    Recommendations for Other Services       Precautions / Restrictions Precautions Precautions: Posterior Hip;Fall;Other (comment) Precaution Booklet Issued: Yes (comment) Precaution Comments: quickly becomes combative Restrictions Weight Bearing Restrictions: Yes LLE Weight Bearing: Weight bearing as tolerated      Mobility  Bed Mobility Overal bed mobility: Needs Assistance Bed Mobility: Supine to Sit;Sit to Supine     Supine to sit: Max assist Sit to supine: Max assist   General bed mobility comments: pt allowed therapist to help her to EOB but resisted motion at times. Maintained sitting 10 mins and then insisted on lying back down. Max A for return to supine.   Transfers                 General transfer comment: did not attempt standing in patient's emotionally labile state.  Ambulation/Gait              General Gait Details: unable  Stairs            Wheelchair Mobility    Modified Rankin (Stroke Patients Only)       Balance Overall balance assessment: Needs assistance Sitting-balance support: Single extremity supported;Feet supported Sitting balance-Leahy Scale: Poor Sitting balance - Comments: needed min A to maintain sitting Postural control: Posterior lean                                   Pertinent Vitals/Pain Pain Assessment: Faces Faces Pain Scale: Hurts even more Pain Location: L hip Pain Descriptors / Indicators: Operative site guarding;Grimacing;Guarding Pain Intervention(s): Limited activity within patient's tolerance;Monitored during session    Home Living Family/patient expects to be discharged to:: Skilled nursing facility                      Prior Function           Comments: pt unable to give any history     Hand Dominance        Extremity/Trunk Assessment   Upper Extremity Assessment Upper Extremity Assessment: Overall WFL for tasks assessed    Lower Extremity Assessment Lower Extremity Assessment: Generalized weakness;LLE deficits/detail;Difficult to assess due to impaired cognition LLE Deficits / Details: witnessed slight movement of LLE but pt keeping it as still as possible due to pain LLE: Unable to fully assess due to pain  Cervical / Trunk Assessment Cervical / Trunk Assessment: Kyphotic  Communication   Communication: No difficulties  Cognition Arousal/Alertness: Awake/alert Behavior During Therapy: (labile) Overall Cognitive Status: No family/caregiver present to determine baseline cognitive functioning Area of Impairment: Orientation;Attention;Memory;Following commands;Safety/judgement;Awareness;Problem solving                 Orientation Level: Disoriented to;Place;Time;Situation Current Attention Level: Sustained Memory: Decreased recall of precautions;Decreased short-term  memory Following Commands: Follows one step commands inconsistently Safety/Judgement: Decreased awareness of safety;Decreased awareness of deficits   Problem Solving: Decreased initiation;Difficulty sequencing;Requires verbal cues;Requires tactile cues;Slow processing General Comments: pt does not know where she is or why she is here. Can be completely calm and joking around about her past and within a second become angry and combative, yelling that she is going to get a Clinical research associate. Will then just as fast calm down and be laughing again      General Comments General comments (skin integrity, edema, etc.): did not review precautions as pt unable to attend to the fact that she hurt her hip or had surgery, just kept them with mobility. Pt pulling at dressing on hip, took off pulse ox, pulling at gown    Exercises     Assessment/Plan    PT Assessment Patient needs continued PT services  PT Problem List Decreased strength;Decreased range of motion;Decreased activity tolerance;Decreased balance;Decreased mobility;Decreased coordination;Decreased cognition;Decreased knowledge of use of DME;Decreased safety awareness;Decreased knowledge of precautions;Pain       PT Treatment Interventions DME instruction;Therapeutic activities;Therapeutic exercise;Functional mobility training;Balance training;Cognitive remediation;Neuromuscular re-education;Patient/family education    PT Goals (Current goals can be found in the Care Plan section)  Acute Rehab PT Goals Patient Stated Goal: pt does not know where she is or why she is here, unable to state any goals PT Goal Formulation: Patient unable to participate in goal setting Time For Goal Achievement: 07/20/17 Potential to Achieve Goals: Fair    Frequency Min 3X/week   Barriers to discharge        Co-evaluation               AM-PAC PT "6 Clicks" Daily Activity  Outcome Measure Difficulty turning over in bed (including adjusting bedclothes,  sheets and blankets)?: Unable Difficulty moving from lying on back to sitting on the side of the bed? : Unable Difficulty sitting down on and standing up from a chair with arms (e.g., wheelchair, bedside commode, etc,.)?: Unable Help needed moving to and from a bed to chair (including a wheelchair)?: Total Help needed walking in hospital room?: Total Help needed climbing 3-5 steps with a railing? : Total 6 Click Score: 6    End of Session   Activity Tolerance: Treatment limited secondary to agitation Patient left: in bed;with call bell/phone within reach;with nursing/sitter in room Nurse Communication: Mobility status PT Visit Diagnosis: Muscle weakness (generalized) (M62.81);Pain Pain - Right/Left: Left Pain - part of body: Hip    Time: 1420-1443 PT Time Calculation (min) (ACUTE ONLY): 23 min   Charges:   PT Evaluation $PT Eval Moderate Complexity: 1 Mod PT Treatments $Therapeutic Activity: 8-22 mins   PT G Codes:        Lyanne Co, PT  Acute Rehab Services  [phone]   [place] Monice Lundy 07/06/2017, 4:30 PM

## 2017-07-06 NOTE — Progress Notes (Signed)
Patient is confused and has removed telemetry monitor from chest and post op surgery dressing on left hip. Incision site is dry and intact. Dermabond present on incision. Charge nurse and daughter/POA Glendon Axe) has been informed. As needed medication has been given. Will re-assess patient to place telemetry monitor and dressing back on patient.

## 2017-07-07 LAB — BASIC METABOLIC PANEL
Anion gap: 7 (ref 5–15)
BUN: 20 mg/dL (ref 6–20)
CHLORIDE: 108 mmol/L (ref 101–111)
CO2: 27 mmol/L (ref 22–32)
Calcium: 8.3 mg/dL — ABNORMAL LOW (ref 8.9–10.3)
Creatinine, Ser: 0.72 mg/dL (ref 0.44–1.00)
GFR calc non Af Amer: >60 mL/min (ref >60)
Glucose, Bld: 87 mg/dL (ref 65–99)
POTASSIUM: 3.8 mmol/L (ref 3.5–5.1)
SODIUM: 142 mmol/L (ref 135–145)

## 2017-07-07 LAB — CBC
HCT: 35.5 % — ABNORMAL LOW (ref 36.0–46.0)
Hemoglobin: 11.3 g/dL — ABNORMAL LOW (ref 12.0–15.0)
MCH: 29.7 pg (ref 26.0–34.0)
MCHC: 31.8 g/dL (ref 30.0–36.0)
MCV: 93.2 fL (ref 78.0–100.0)
PLATELETS: 132 10*3/uL — AB (ref 150–400)
RBC: 3.81 MIL/uL — ABNORMAL LOW (ref 3.87–5.11)
RDW: 14.4 % (ref 11.5–15.5)
WBC: 5.8 10*3/uL (ref 4.0–10.5)

## 2017-07-07 MED ORDER — SODIUM CHLORIDE 0.45 % IV SOLN
INTRAVENOUS | Status: DC
  Administered 2017-07-07 – 2017-07-08 (×2): via INTRAVENOUS

## 2017-07-07 NOTE — Social Work (Addendum)
CSW faxed clinicals to ncmust to obtain PASSR for SNF placement.  CSW made referrals to SNF's to see who can offer a SNF bed.  CSW will f/u.  CSW discussed case with admission staff, Peggy at [facility] and they are reviewing bed offer.  12:34pm SNF confirmed SNF bed offer at [facility] CSW then called daughter and she accepted. CSW then faxed clinicals to [employer] to initiate Insurance Auth for placement.  CSW will f/u.  Keene Breath, LCSW Clinical Social Worker [phone]

## 2017-07-07 NOTE — Care Management Important Message (Signed)
Important Message  Patient Details  Name: Michelle Savage MRN: [id number] Date of Birth: 1924/04/17   Medicare Important Message Given:  Yes    Jesseca Marsch Stefan Church 07/07/2017, 1:56 PM

## 2017-07-07 NOTE — NC FL2 (Signed)
[place] MEDICAID FL2 LEVEL OF CARE SCREENING TOOL     IDENTIFICATION  Patient Name: Michelle Savage Birthdate: [birth year] Sex: female Admission Date (Current Location): 07/04/2017  [facility] and [place] Number:  Producer, television/film/video and Address:  [facility], [address] [place], [place] [address]      Provider Number: [id number]  Attending Physician Name and Address:  Alwyn Ren, MD  Relative Name and Phone Number:  [name], daughter, [phone]    Current Level of Care: Hospital Recommended Level of Care: Skilled Nursing Facility Prior Approval Number:    Date Approved/Denied:   PASRR Number: pending   Discharge Plan: SNF    Current Diagnoses: Patient Active Problem List   Diagnosis Date Noted  . Thrombocytopenia (HCC) 07/05/2017  . Prolonged QT interval 07/05/2017  . Fall at home, initial encounter 07/04/2017  . Closed left hip fracture, initial encounter (HCC) 07/04/2017  . Palliative care by specialist   . DNR (do not resuscitate)   . Weakness generalized   . Depression 04/26/2017  . Shortness of breath 04/26/2017  . Aspiration into airway 04/26/2017  . Memory deficit 10/08/2012  . TIA (transient ischemic attack) 12/30/2010  . Alzheimer's dementia 12/30/2010  . Hyperglycemia 12/30/2010  . Essential hypertension 01/15/2009    Orientation RESPIRATION BLADDER Height & Weight     Self, Time, Situation, Place  Normal Incontinent Weight: 128 lb (58.1 kg) Height:  5' 2.01" (157.5 cm)  BEHAVIORAL SYMPTOMS/MOOD NEUROLOGICAL BOWEL NUTRITION STATUS  (Memory)   Continent Diet(See DC Summary)  AMBULATORY STATUS COMMUNICATION OF NEEDS Skin   Extensive Assist Verbally Surgical wounds                       Personal Care Assistance Level of Assistance  Bathing, Feeding, Dressing Bathing Assistance: Maximum assistance Feeding assistance: Limited assistance Dressing Assistance: Maximum assistance      Functional Limitations Info  Sight, Hearing, Speech Sight Info: Adequate Hearing Info: Adequate Speech Info: Adequate    SPECIAL CARE FACTORS FREQUENCY  PT (By licensed PT), OT (By licensed OT)     PT Frequency: 3xvweek OT Frequency: 3x week            Contractures      Additional Factors Info  Code Status, Allergies, Psychotropic Code Status Info: DNR Allergies Info: PENICILLINS, ATIVAN LORAZEPAM  Psychotropic Info: Namenda         Current Medications (07/07/2017):  This is the current hospital active medication list Current Facility-Administered Medications  Medication Dose Route Frequency Provider Last Rate Last Dose  . 0.45 % sodium chloride infusion   Intravenous Continuous Alwyn Ren, MD      . acetaminophen (TYLENOL) tablet 650 mg  650 mg Oral Q6H PRN Pokhrel, Laxman, MD       Or  . acetaminophen (TYLENOL) suppository 650 mg  650 mg Rectal Q6H PRN Pokhrel, Laxman, MD      . aspirin EC tablet 325 mg  325 mg Oral BID Lanney Gins, PA-C   325 mg at 07/07/17 0753  . celecoxib (CELEBREX) capsule 200 mg  200 mg Oral BID Lanney Gins, PA-C   200 mg at 07/07/17 0754  . docusate sodium (COLACE) capsule 100 mg  100 mg Oral BID Lanney Gins, PA-C   100 mg at 07/06/17 0901  . fentaNYL (SUBLIMAZE) injection 25 mcg  25 mcg Intravenous Q2H PRN Lanney Gins, PA-C   25 mcg at 07/05/17 2013  . ferrous  sulfate tablet 325 mg  325 mg Oral TID PC Lanney Gins, PA-C   325 mg at 07/06/17 1655  . HYDROcodone-acetaminophen (NORCO/VICODIN) 5-325 MG per tablet 1 tablet  1 tablet Oral Q4H PRN Lanney Gins, PA-C   1 tablet at 07/06/17 1656  . HYDROcodone-acetaminophen (NORCO/VICODIN) 5-325 MG per tablet 2 tablet  2 tablet Oral Q4H PRN Lanney Gins, PA-C      . lactated ringers infusion   Intravenous Continuous Lanney Gins, PA-C 10 mL/hr at 07/05/17 1559    . lactose free nutrition (Boost) liquid 237 mL  237 mL Oral BID BM Lanney Gins, PA-C   237 mL at  07/06/17 1015  . LORazepam (ATIVAN) tablet 0.5 mg  0.5 mg Oral BID PRN Standley Brooking, MD   0.5 mg at 07/07/17 0754  . LORazepam (ATIVAN) tablet 0.5 mg  0.5 mg Oral Once Kirby-Graham, Beather Arbour, NP      . memantine [facility]) tablet 10 mg  10 mg Oral BID Lanney Gins, PA-C   10 mg at 07/07/17 0754  . menthol-cetylpyridinium (CEPACOL) lozenge 3 mg  1 lozenge Oral PRN Lanney Gins, PA-C       Or  . phenol (CHLORASEPTIC) mouth spray 1 spray  1 spray Mouth/Throat PRN Babish, Matthew, PA-C      . methocarbamol (ROBAXIN) tablet 500 mg  500 mg Oral Q6H PRN Lanney Gins, PA-C   500 mg at 07/06/17 1655   Or  . methocarbamol (ROBAXIN) 500 mg in dextrose 5 % 50 mL IVPB  500 mg Intravenous Q6H PRN Babish, Molli Hazard, PA-C      . ondansetron (ZOFRAN) tablet 4 mg  4 mg Oral Q6H PRN Lanney Gins, PA-C       Or  . ondansetron (ZOFRAN) injection 4 mg  4 mg Intravenous Q6H PRN Babish, Matthew, PA-C      . polyethylene glycol (MIRALAX / GLYCOLAX) packet 17 g  17 g Oral Daily PRN Lanney Gins, PA-C   17 g at 07/06/17 0840     Discharge Medications: Please see discharge summary for a list of discharge medications.  Relevant Imaging Results:  Relevant Lab Results:   Additional Information SS#: [id number]  Tresa Moore, LCSW

## 2017-07-07 NOTE — Progress Notes (Signed)
Patient confused and agitated;refused to take meds;pulled out peripheral IV;telebox removed,CCMD aware;post op dressing also removed;Bed alarm on and hourly monitoring done as of this writing

## 2017-07-07 NOTE — Progress Notes (Signed)
PROGRESS NOTE    Michelle Savage  AVW:[id number] DOB: 06/12/1924 DOA: 07/04/2017 PCP: [name], MD Brief Narrative:89yow PMH advanced dementia with behavioral disturbance presented with leg pain s/p fall at [facility]. Admitted for left hip fx. Underwent left hip hemiarthroplasty 5/20 without apparent complication    Assessment & Plan:   Principal Problem:   Closed left hip fracture, initial encounter [facility]) Active Problems:   Essential hypertension   Alzheimer's dementia   DNR (do not resuscitate)   Weakness generalized   Fall at home, initial encounter   Thrombocytopenia (HCC)   Prolonged QT interval  Left femoral neck fx s/p fall --management per orthopedics including pain and DVT prophylaxis.  Thrombocytopenia --Platelets trending back upwards.  Etiology unclear. --Check CBC in a.m.  Prolonged QT --new since 04/2017 , probably olanzepine give recent start. --sertraline, ondansetron, olanzepine, Aricept were discontinued. --repeat EKG 5/20 showed decreased QT interval.  Potassium was repleted, magnesium was within normal limits.  EKG  Pending for today.  Could consider restarting olanzapine and following closely in the outpatient setting.  Given patient's advanced dementia, doubt Aricept of much benefit the patient, given its propensity for cardiac arrhythmias, would discontinue this medication. --Can probably restart sertraline tomorrow once ekg resulted.  Alzheimer's dementia  --at baseline pt is aggressive and hyperverbal --Ativan as needed.  Continue Namenda.  Otherwise plan as above under prolonged QT.     DVT prophylaxis:scd Code Status:dnr Family Communication:none Disposition Plan to snf  Consultants:  orhto   Procedures:5/20 Lefthip hemiarthroplasty utilizing DePuy component, size 4 HiTri-Lock stem with a ball with a -3adapter    Antimicrobials none Subjective: Resting in bed in nad  Objective: Vitals:   07/06/17 1441 07/06/17 2134 07/07/17 0500 07/07/17 0606  BP: (!) 112/59   (!) 148/68  Pulse: 66   80  Resp: 16     Temp: 98.2 F (36.8 C)   98 F (36.7 C)  TempSrc: Oral Other (Comment)  Oral  SpO2: (!) 80%   97%  Weight:   58.1 kg (128 lb)   Height:        Intake/Output Summary (Last 24 hours) at 07/07/2017 1213 Last data filed at 07/07/2017 0900 Gross per 24 hour  Intake 1409.58 ml  Output 560 ml  Net 849.58 ml   Filed Weights   07/04/17 2103 07/05/17 1557 07/07/17 0500  Weight: 56.7 kg (125 lb) 56.7 kg (125 lb) 58.1 kg (128 lb)    Examination:  General exam: Appears calm and comfortable  Respiratory system: Clear to auscultation. Respiratory effort normal. Cardiovascular system: S1 & S2 heard, RRR. No JVD, murmurs, rubs, gallops or clicks. No pedal edema. Gastrointestinal system: Abdomen is nondistended, soft and nontender. No organomegaly or masses felt. Normal bowel sounds heard. Central nervous system: Alert and oriented. No focal neurological deficits. Extremities: Symmetric 5 x 5 power. Skin: No rashes, lesions or ulcers    Data Reviewed: I have personally reviewed following labs and imaging studies  CBC: Recent Labs  Lab 07/04/17 2210 07/05/17 0415 07/06/17 0736 07/07/17 0641  WBC 9.5 8.5 7.2 5.8  NEUTROABS 8.2*  --   --   --   HGB 14.5 12.5 12.9 11.3*  HCT 45.5 38.4 40.4 35.5*  MCV 91.5 91.6 94.2 93.2  PLT 82* 120* 129* 132*   Basic Metabolic Panel: Recent Labs  Lab 07/04/17 2210 07/05/17 0415 07/06/17 0736 07/07/17 0641  NA 143 141 140 142  K 3.8 3.4* 4.2 3.8  CL 106 107 108 108  CO2 GLUCOSE 111* 99 96 87  BUN 16 16 23* 20  CREATININE 0.86 0.80 0.85 0.72  CALCIUM 9.2 8.5* 8.6* 8.3*  MG  --  2.1  --   --    GFR: Estimated Creatinine Clearance: 36.2 mL/min (by C-G formula based on SCr of 0.72 mg/dL). Liver Function Tests: Recent Labs  Lab 07/05/17 0415  AST 23  ALT 16  ALKPHOS 67  BILITOT 1.0  PROT 6.0*  ALBUMIN 2.8*    No results for input(s): LIPASE, AMYLASE in the last 168 hours. No results for input(s): AMMONIA in the last 168 hours. Coagulation Profile: Recent Labs  Lab 07/04/17 2210  INR 1.07   Cardiac Enzymes: No results for input(s): CKTOTAL, CKMB, CKMBINDEX, TROPONINI in the last 168 hours. BNP (last 3 results) No results for input(s): PROBNP in the last 8760 hours. HbA1C: No results for input(s): HGBA1C in the last 72 hours. CBG: Recent Labs  Lab 07/05/17 1536  GLUCAP 74   Lipid Profile: No results for input(s): CHOL, HDL, LDLCALC, TRIG, CHOLHDL, LDLDIRECT in the last 72 hours. Thyroid Function Tests: No results for input(s): TSH, T4TOTAL, FREET4, T3FREE, THYROIDAB in the last 72 hours. Anemia Panel: No results for input(s): VITAMINB12, FOLATE, FERRITIN, TIBC, IRON, RETICCTPCT in the last 72 hours. Sepsis Labs: No results for input(s): PROCALCITON, LATICACIDVEN in the last 168 hours.  No results found for this or any previous visit (from the past 240 hour(s)).       Radiology Studies: Pelvis Portable  Result Date: 07/05/2017 CLINICAL DATA:  Status post hip hemiarthroplasty. EXAM: PORTABLE PELVIS 1-2 VIEWS COMPARISON:  Preoperative radiographs 07/04/2017 FINDINGS: Left hip hemiarthroplasty in expected alignment. No periprosthetic fracture. Recent postsurgical change includes air in the joint and subcutaneous tissues. IMPRESSION: Recent left hip hemiarthroplasty in expected alignment without immediate postoperative complication. Electronically Signed   By: Rubye Oaks M.D.   On: 07/05/2017 21:32        Scheduled Meds: . aspirin EC  325 mg Oral BID  . celecoxib  200 mg Oral BID  . docusate sodium  100 mg Oral BID  . ferrous sulfate  325 mg Oral TID PC  . lactose free nutrition  237 mL Oral BID BM  . LORazepam  0.5 mg Oral Once  . memantine  10 mg Oral BID   Continuous Infusions: . sodium chloride    . lactated ringers 10 mL/hr at 07/05/17 1559  . methocarbamol  (ROBAXIN)  IV       LOS: 3 days     Alwyn Ren, MD  If 7PM-7AM, please contact night-coverage [email or web address] Password [facility] 07/07/2017, 12:13 PM

## 2017-07-07 NOTE — Progress Notes (Signed)
Orthopedic Tech Progress Note Patient Details:  Michelle Savage 19-Mar-1924 [id number] Patient exceeds age requirements for ohf. Patient ID: [name], female   DOB: Jun 19, 1924, 84 y.o.   MRN: [id number]   Jennye Moccasin 07/07/2017, 10:27 PM

## 2017-07-08 DIAGNOSIS — I4581 Long QT syndrome: Secondary | ICD-10-CM | POA: Diagnosis not present

## 2017-07-08 DIAGNOSIS — G309 Alzheimer's disease, unspecified: Secondary | ICD-10-CM | POA: Diagnosis not present

## 2017-07-08 DIAGNOSIS — M25552 Pain in left hip: Secondary | ICD-10-CM | POA: Diagnosis not present

## 2017-07-08 DIAGNOSIS — S72002D Fracture of unspecified part of neck of left femur, subsequent encounter for closed fracture with routine healing: Secondary | ICD-10-CM | POA: Diagnosis not present

## 2017-07-08 DIAGNOSIS — S72002A Fracture of unspecified part of neck of left femur, initial encounter for closed fracture: Secondary | ICD-10-CM | POA: Diagnosis not present

## 2017-07-08 DIAGNOSIS — F0391 Unspecified dementia with behavioral disturbance: Secondary | ICD-10-CM | POA: Diagnosis not present

## 2017-07-08 DIAGNOSIS — F0390 Unspecified dementia without behavioral disturbance: Secondary | ICD-10-CM | POA: Diagnosis not present

## 2017-07-08 DIAGNOSIS — D696 Thrombocytopenia, unspecified: Secondary | ICD-10-CM | POA: Diagnosis not present

## 2017-07-08 DIAGNOSIS — M6281 Muscle weakness (generalized): Secondary | ICD-10-CM | POA: Diagnosis not present

## 2017-07-08 DIAGNOSIS — Z7401 Bed confinement status: Secondary | ICD-10-CM | POA: Diagnosis not present

## 2017-07-08 DIAGNOSIS — Z9181 History of falling: Secondary | ICD-10-CM | POA: Diagnosis not present

## 2017-07-08 DIAGNOSIS — R2689 Other abnormalities of gait and mobility: Secondary | ICD-10-CM | POA: Diagnosis not present

## 2017-07-08 DIAGNOSIS — M2550 Pain in unspecified joint: Secondary | ICD-10-CM | POA: Diagnosis not present

## 2017-07-08 DIAGNOSIS — F329 Major depressive disorder, single episode, unspecified: Secondary | ICD-10-CM | POA: Diagnosis not present

## 2017-07-08 DIAGNOSIS — I10 Essential (primary) hypertension: Secondary | ICD-10-CM | POA: Diagnosis not present

## 2017-07-08 DIAGNOSIS — F339 Major depressive disorder, recurrent, unspecified: Secondary | ICD-10-CM | POA: Diagnosis not present

## 2017-07-08 DIAGNOSIS — F0280 Dementia in other diseases classified elsewhere without behavioral disturbance: Secondary | ICD-10-CM | POA: Diagnosis not present

## 2017-07-08 DIAGNOSIS — D649 Anemia, unspecified: Secondary | ICD-10-CM | POA: Diagnosis not present

## 2017-07-08 DIAGNOSIS — R269 Unspecified abnormalities of gait and mobility: Secondary | ICD-10-CM | POA: Diagnosis not present

## 2017-07-08 DIAGNOSIS — R1310 Dysphagia, unspecified: Secondary | ICD-10-CM | POA: Diagnosis not present

## 2017-07-08 MED ORDER — POLYETHYLENE GLYCOL 3350 17 G PO PACK: 17.0000 g | PACK | Freq: Every day | ORAL | 0 refills | Status: DC | PRN

## 2017-07-08 MED ORDER — DOCUSATE SODIUM 100 MG PO CAPS
100.0000 mg | ORAL_CAPSULE | Freq: Two times a day (BID) | ORAL | 0 refills | Status: DC
Start: 2017-07-08 — End: 2017-07-08

## 2017-07-08 MED ORDER — CELECOXIB 200 MG PO CAPS: 200.0000 mg | ORAL_CAPSULE | Freq: Two times a day (BID) | ORAL | 0 refills | Status: AC

## 2017-07-08 MED ORDER — ASPIRIN 325 MG PO TBEC: 325.0000 mg | DELAYED_RELEASE_TABLET | Freq: Two times a day (BID) | ORAL | 0 refills | Status: DC

## 2017-07-08 MED ORDER — ASPIRIN EC 325 MG PO TBEC: 325.0000 mg | DELAYED_RELEASE_TABLET | Freq: Every day | ORAL | 3 refills | Status: DC

## 2017-07-08 MED ORDER — ASPIRIN 325 MG PO TBEC: 325.0000 mg | DELAYED_RELEASE_TABLET | Freq: Two times a day (BID) | ORAL | 0 refills | Status: AC

## 2017-07-08 MED ORDER — ACETAMINOPHEN 325 MG PO TABS: 650.0000 mg | ORAL_TABLET | Freq: Four times a day (QID) | ORAL | Status: AC | PRN

## 2017-07-08 MED ORDER — FERROUS SULFATE 325 (65 FE) MG PO TABS: 325.0000 mg | ORAL_TABLET | Freq: Three times a day (TID) | ORAL | 3 refills | Status: DC

## 2017-07-08 MED ORDER — POLYETHYLENE GLYCOL 3350 17 G PO PACK: 17.0000 g | PACK | Freq: Every day | ORAL | 0 refills | Status: AC | PRN

## 2017-07-08 NOTE — Social Work (Addendum)
CSW awaiting PASSR for [facility] placement.  CSW received Insurance [phone] for SNF placement.  CSW f/u.  XBJYN#[id number] Dalbert Mayotte, LCSW Clinical Social Worker [phone]

## 2017-07-08 NOTE — Progress Notes (Signed)
Patient ID: Michelle Savage, female   DOB: December 18, 1924, 84 y.o.   MRN: [id number] Subjective: 3 Days Post-Op Procedure(s) (LRB): ARTHROPLASTY BIPOLAR HIP (HEMIARTHROPLASTY) (Left)    Patient is demented.  This am asking if her husband had come by.  No events reported.  Pleasant this am. Unfortunately daughter not in room when I was by at 7ish  Objective:   VITALS:   Vitals:   07/07/17 2046 07/08/17 0435  BP: (!) 147/67 (!) 129/94  Pulse: 99 79  Resp:    Temp: 98.7 F (37.1 C) 98 F (36.7 C)  SpO2: 92% (!) 87%    Neurovascular intact - observed moving both lower extremities Incision: dressing C/D/I - left hip  LABS Recent Labs    07/06/17 0736 07/07/17 0641  HGB 12.9 11.3*  HCT 40.4 35.5*  WBC 7.2 5.8  PLT 129* 132*    Recent Labs    07/06/17 0736 07/07/17 0641  NA 140 142  K 4.2 3.8  BUN 23* 20  CREATININE 0.85 0.72  GLUCOSE 96 87    No results for input(s): LABPT, INR in the last 72 hours.   Assessment/Plan: 3 Days Post-Op Procedure(s) (LRB): ARTHROPLASTY BIPOLAR HIP (HEMIARTHROPLASTY) (Left)   Up with therapy  Disposition plan per medicine team, social work, and daughter  RTC to see me in 2 weeks WBAT LLE Aspirin for DVT prophylaxis for 4 weeks Call with any questions [phone] or office at [phone]

## 2017-07-08 NOTE — Progress Notes (Signed)
RN called and gave carol, RN report pt comfortable and asleep. Awaiting  ptar transport.

## 2017-07-08 NOTE — Clinical Social Work Placement (Signed)
   CLINICAL SOCIAL WORK PLACEMENT  NOTE  Date:  07/08/2017  Patient Details  Name: Michelle Savage MRN: [id number] Date of Birth: 11-06-1924  Clinical Social Work is seeking post-discharge placement for this patient at the Skilled  Nursing Facility level of care (*CSW will initial, date and re-position this form in  chart as items are completed):  Yes   Patient/family provided with [place] Clinical Social Work Department's list of facilities offering this level of care within the geographic area requested by the patient (or if unable, by the patient's family).  Yes   Patient/family informed of their freedom to choose among providers that offer the needed level of care, that participate in Medicare, Medicaid or managed care program needed by the patient, have an available bed and are willing to accept the patient.  Yes   Patient/family informed of [facility]'s ownership interest in [facility] and [facility], as well as of the fact that they are under no obligation to receive care at these facilities.  PASRR submitted to EDS on       PASRR number received on 07/08/17     Existing PASRR number confirmed on       FL2 transmitted to all facilities in geographic area requested by pt/family on 07/07/17     FL2 transmitted to all facilities within larger geographic area on       Patient informed that his/her managed care company has contracts with or will negotiate with certain facilities, including the following:        Yes   Patient/family informed of bed offers received.  Patient [facility] bed at [facility]     Physician recommends and patient chooses bed at      Patient to be transferred to [facility] at [place] on 07/08/17.  Patient to be transferred to facility by [facility]     Patient family notified on 07/08/17 of transfer.  Name of family member notified:  daughter Carlisle Beers contacted     PHYSICIAN       Additional Comment:     _______________________________________________ Tresa Moore, LCSW 07/08/2017, 10:37 AM

## 2017-07-08 NOTE — Social Work (Signed)
Clinical Social Worker facilitated patient discharge including contacting patient family and facility to confirm patient discharge plans.  Clinical information faxed to facility and family agreeable with plan.    CSW arranged ambulance transport via [facility] to [place] [facility] [place].    RN to call [phone] to give report prior to discharge.  Clinical Social Worker will sign off for now as social work intervention is no longer needed. Please consult [place] again if new need arises.  Keene Breath, LCSW Clinical Social Worker [phone]

## 2017-07-08 NOTE — Discharge Summary (Addendum)
Physician Discharge Summary  Michelle Savage WUJ:[id number] DOB: November 16, 1924 DOA: 07/04/2017  PCP: Michelle Raider, MD  Admit date: 07/04/2017 Discharge date: 07/08/2017  Admitted From: Home Disposition:  Skilled nursing facility Recommendations for Outpatient Follow-up:  1. Follow up with PCP in 1-2 weeks 2. Please obtain BMP/CBC in one week 3. Follow-up with Ortho in 2 weeks  Home Health none Equipment/Devices: None Discharge Condition stable CODE STATUS DO NOT RESUSCITATE Diet recommendation: Cardiac Brief/Interim Summary:89yow PMH advanced dementia with behavioral disturbance presented with leg pain s/p fall at [facility]. Admitted for left hip fx.Underwent left hip hemiarthroplasty 5/20 without apparent complication    Discharge Diagnoses:  Principal Problem:   Closed left hip fracture, initial encounter [facility] Active Problems:   Essential hypertension   Alzheimer's dementia   DNR (do not resuscitate)   Weakness generalized   Fall at home, initial encounter   Thrombocytopenia (HCC)   Prolonged QT interval  Left femoral neck fx s/p fall-status post hemiarthroplasty of the left hip  Thrombocytopenia-stable   Prolonged QT-restart Zyprexa 2.5 mg daily along with SSRI.  DC Aricept and Namenda. Alzheimer's dementia  --at baseline pt is aggressive and hyperverbal --Ativan as needed.        Discharge Instructions  Discharge Instructions    Call MD for:  difficulty breathing, headache or visual disturbances   Complete by:  As directed    Call MD for:  persistant nausea and vomiting   Complete by:  As directed    Call MD for:  redness, tenderness, or signs of infection (pain, swelling, redness, odor or green/yellow discharge around incision site)   Complete by:  As directed    Call MD for:  severe uncontrolled pain   Complete by:  As directed    Diet - low sodium heart healthy   Complete by:  As directed    Increase activity slowly   Complete by:   As directed    Weight bearing as tolerated   Complete by:  As directed    Laterality:  left   Extremity:  Lower     Allergies as of 07/08/2017      Reactions   Penicillins Rash   Has patient had a PCN reaction causing immediate rash, facial/tongue/throat swelling, SOB or lightheadedness with hypotension: Yes Has patient had a PCN reaction causing severe rash involving mucus membranes or skin necrosis: No Has patient had a PCN reaction that required hospitalization: Unknown Has patient had a PCN reaction occurring within the last 10 years: No If all of the above answers are "NO", then may proceed with Cephalosporin use.   Ativan [lorazepam]    Agitation       Medication List    STOP taking these medications   donepezil 23 MG Tabs tablet Commonly known as:  ARICEPT   ibuprofen 200 MG tablet Commonly known as:  ADVIL,MOTRIN   lactose free nutrition Liqd   loperamide 2 MG tablet Commonly known as:  IMODIUM A-D   memantine 10 MG tablet Commonly known as:  NAMENDA     TAKE these medications   acetaminophen 325 MG tablet Commonly known as:  TYLENOL Take 2 tablets (650 mg total) by mouth every 6 (six) hours as needed for mild pain (or Fever >/= 101).   aspirin 325 MG EC tablet Take 1 tablet (325 mg total) by mouth 2 (two) times daily.   celecoxib 200 MG capsule Commonly known as:  CELEBREX Take 1 capsule (200 mg total) by mouth 2 (two) times  daily.   docusate sodium 100 MG capsule Commonly known as:  COLACE Take 1 capsule (100 mg total) by mouth 2 (two) times daily.   LORazepam 0.5 MG tablet Commonly known as:  ATIVAN Take 0.25 mg by mouth 2 (two) times daily as needed for anxiety. With daughters permission, Hard copy Rx Required.   methocarbamol 500 MG tablet Commonly known as:  ROBAXIN Take 1 tablet (500 mg total) by mouth every 6 (six) hours as needed for muscle spasms.   OLANZapine 2.5 MG tablet Commonly known as:  ZYPREXA Take 2.5 mg by mouth daily.    polyethylene glycol packet Commonly known as:  MIRALAX / GLYCOLAX Take 17 g by mouth daily as needed for mild constipation.   sertraline 100 MG tablet Commonly known as:  ZOLOFT Take 100 mg by mouth daily.            Discharge Care Instructions  (From admission, onward)        Start     Ordered   07/05/17 0000  Weight bearing as tolerated    Question Answer Comment  Laterality left   Extremity Lower      07/05/17 1845      Contact information for follow-up providers    Durene Romans, MD. Schedule an appointment as soon as possible for a visit in 2 weeks.   Specialty:  Orthopedic Surgery Contact information: [address] [place] [address] [phone]        Michelle Raider, MD Follow up.   Specialty:  Family Medicine Contact information: [address] [place] [address] [phone]            Contact information for after-discharge care    Destination    [facility] .   Service:  Skilled Nursing Contact information: [address] [place] [address] [phone]                 Allergies  Allergen Reactions  . Penicillins Rash    Has patient had a PCN reaction causing immediate rash, facial/tongue/throat swelling, SOB or lightheadedness with hypotension: Yes Has patient had a PCN reaction causing severe rash involving mucus membranes or skin necrosis: No Has patient had a PCN reaction that required hospitalization: Unknown Has patient had a PCN reaction occurring within the last 10 years: No If all of the above answers are "NO", then may proceed with Cephalosporin use.  Michelle Savage [Lorazepam]     Agitation     Consultations: orhto  Procedures/Studies: Dg Chest 1 View  Result Date: 07/04/2017 CLINICAL DATA:  Preop EXAM: CHEST  1 VIEW COMPARISON:  04/27/2017 FINDINGS: Cardiomegaly. No confluent airspace opacities, effusions or edema. No acute bony  abnormality. IMPRESSION: Cardiomegaly.  No active disease. Electronically Signed   By: Charlett Nose M.D.   On: 07/04/2017 23:18   Pelvis Portable  Result Date: 07/05/2017 CLINICAL DATA:  Status post hip hemiarthroplasty. EXAM: PORTABLE PELVIS 1-2 VIEWS COMPARISON:  Preoperative radiographs 07/04/2017 FINDINGS: Left hip hemiarthroplasty in expected alignment. No periprosthetic fracture. Recent postsurgical change includes air in the joint and subcutaneous tissues. IMPRESSION: Recent left hip hemiarthroplasty in expected alignment without immediate postoperative complication. Electronically Signed   By: Rubye Oaks M.D.   On: 07/05/2017 21:32   Dg Hip Unilat With Pelvis 2-3 Views Left  Result Date: 07/04/2017 CLINICAL DATA:  Fall EXAM: DG HIP (WITH OR WITHOUT PELVIS) 2-3V LEFT COMPARISON:  None. FINDINGS: There is a left femoral neck  fracture with impaction and varus angulation. Mild degenerative changes in the hips bilaterally. No subluxation or dislocation. IMPRESSION: Left femoral neck fracture with impaction and varus angulation. Electronically Signed   By: Charlett Nose M.D.   On: 07/04/2017 23:19   (Echo, Carotid, EGD, Colonoscopy, ERCP)    Subjective:   Discharge Exam: Vitals:   07/07/17 2046 07/08/17 0435  BP: (!) 147/67 (!) 129/94  Pulse: 99 79  Resp:    Temp: 98.7 F (37.1 C) 98 F (36.7 C)  SpO2: 92% (!) 87%   Vitals:   07/07/17 1245 07/07/17 2046 07/08/17 0435 07/08/17 0500  BP: (!) 100/53 (!) 147/67 (!) 129/94   Pulse: 75 99 79   Resp: 15     Temp: 97.9 F (36.6 C) 98.7 F (37.1 C) 98 F (36.7 C)   TempSrc: Oral     SpO2: 91% 92% (!) 87%   Weight:    58.3 kg (128 lb 9.6 oz)  Height:        General: Pt is alert, awake, not in acute distress Cardiovascular: RRR, S1/S2 +, no rubs, no gallops Respiratory: CTA bilaterally, no wheezing, no rhonchi Abdominal: Soft, NT, ND, bowel sounds + Extremities: no edema, no cyanosis    The results of significant  diagnostics from this hospitalization (including imaging, microbiology, ancillary and laboratory) are listed below for reference.     Microbiology: No results found for this or any previous visit (from the past 240 hour(s)).   Labs: BNP (last 3 results) Recent Labs    04/26/17 1217  BNP 367.1*   Basic Metabolic Panel: Recent Labs  Lab 07/04/17 2210 07/05/17 0415 07/06/17 0736 07/07/17 0641  NA 143 141 140 142  K 3.8 3.4* 4.2 3.8  CL 106 107 108 108  CO2 GLUCOSE 111* 99 96 87  BUN 16 16 23* 20  CREATININE 0.86 0.80 0.85 0.72  CALCIUM 9.2 8.5* 8.6* 8.3*  MG  --  2.1  --   --    Liver Function Tests: Recent Labs  Lab 07/05/17 0415  AST 23  ALT 16  ALKPHOS 67  BILITOT 1.0  PROT 6.0*  ALBUMIN 2.8*   No results for input(s): LIPASE, AMYLASE in the last 168 hours. No results for input(s): AMMONIA in the last 168 hours. CBC: Recent Labs  Lab 07/04/17 2210 07/05/17 0415 07/06/17 0736 07/07/17 0641  WBC 9.5 8.5 7.2 5.8  NEUTROABS 8.2*  --   --   --   HGB 14.5 12.5 12.9 11.3*  HCT 45.5 38.4 40.4 35.5*  MCV 91.5 91.6 94.2 93.2  PLT 82* 120* 129* 132*   Cardiac Enzymes: No results for input(s): CKTOTAL, CKMB, CKMBINDEX, TROPONINI in the last 168 hours. BNP: Invalid input(s): POCBNP CBG: Recent Labs  Lab 07/05/17 1536  GLUCAP 74   D-Dimer No results for input(s): DDIMER in the last 72 hours. Hgb A1c No results for input(s): HGBA1C in the last 72 hours. Lipid Profile No results for input(s): CHOL, HDL, LDLCALC, TRIG, CHOLHDL, LDLDIRECT in the last 72 hours. Thyroid function studies No results for input(s): TSH, T4TOTAL, T3FREE, THYROIDAB in the last 72 hours.  Invalid input(s): FREET3 Anemia work up No results for input(s): VITAMINB12, FOLATE, FERRITIN, TIBC, IRON, RETICCTPCT in the last 72 hours. Urinalysis    Component Value Date/Time   COLORURINE YELLOW 04/26/2017 1747   APPEARANCEUR CLOUDY (A) 04/26/2017 1747   LABSPEC 1.023  04/26/2017 1747   PHURINE 5.0 04/26/2017 1747   GLUCOSEU  NEGATIVE 04/26/2017 1747   HGBUR NEGATIVE 04/26/2017 1747   BILIRUBINUR NEGATIVE 04/26/2017 1747   KETONESUR 5 (A) 04/26/2017 1747   PROTEINUR 100 (A) 04/26/2017 1747   UROBILINOGEN 1.0 12/30/2010 1425   NITRITE NEGATIVE 04/26/2017 1747   LEUKOCYTESUR NEGATIVE 04/26/2017 1747   Sepsis Labs Invalid input(s): PROCALCITONIN,  WBC,  LACTICIDVEN Microbiology No results found for this or any previous visit (from the past 240 hour(s)).   Time coordinating discharge: 39 minutes  SIGNED:   Alwyn Ren, MD  [facility] 07/08/2017, 1:13 PM Pager   If 7PM-7AM, please contact night-coverage [email or web address] Password [email or web address]

## 2017-07-09 DIAGNOSIS — Z9181 History of falling: Secondary | ICD-10-CM | POA: Diagnosis not present

## 2017-07-09 DIAGNOSIS — S72002D Fracture of unspecified part of neck of left femur, subsequent encounter for closed fracture with routine healing: Secondary | ICD-10-CM | POA: Diagnosis not present

## 2017-07-09 DIAGNOSIS — F339 Major depressive disorder, recurrent, unspecified: Secondary | ICD-10-CM | POA: Diagnosis not present

## 2017-07-09 DIAGNOSIS — F0391 Unspecified dementia with behavioral disturbance: Secondary | ICD-10-CM | POA: Diagnosis not present

## 2017-07-15 DIAGNOSIS — M25552 Pain in left hip: Secondary | ICD-10-CM | POA: Diagnosis not present

## 2017-07-15 DIAGNOSIS — R2689 Other abnormalities of gait and mobility: Secondary | ICD-10-CM | POA: Diagnosis not present

## 2017-07-15 DIAGNOSIS — F0280 Dementia in other diseases classified elsewhere without behavioral disturbance: Secondary | ICD-10-CM | POA: Diagnosis not present

## 2017-07-21 DIAGNOSIS — F329 Major depressive disorder, single episode, unspecified: Secondary | ICD-10-CM | POA: Diagnosis not present

## 2017-07-21 DIAGNOSIS — D649 Anemia, unspecified: Secondary | ICD-10-CM | POA: Diagnosis not present

## 2017-07-21 DIAGNOSIS — F0390 Unspecified dementia without behavioral disturbance: Secondary | ICD-10-CM | POA: Diagnosis not present

## 2017-07-21 DIAGNOSIS — R1310 Dysphagia, unspecified: Secondary | ICD-10-CM | POA: Diagnosis not present

## 2017-07-23 DIAGNOSIS — M6281 Muscle weakness (generalized): Secondary | ICD-10-CM | POA: Diagnosis not present

## 2017-07-23 DIAGNOSIS — R269 Unspecified abnormalities of gait and mobility: Secondary | ICD-10-CM | POA: Diagnosis not present

## 2017-08-10 DIAGNOSIS — G309 Alzheimer's disease, unspecified: Secondary | ICD-10-CM | POA: Diagnosis not present

## 2017-08-10 DIAGNOSIS — F0391 Unspecified dementia with behavioral disturbance: Secondary | ICD-10-CM | POA: Diagnosis not present

## 2017-08-10 DIAGNOSIS — F419 Anxiety disorder, unspecified: Secondary | ICD-10-CM | POA: Diagnosis not present

## 2017-08-18 DIAGNOSIS — Z96642 Presence of left artificial hip joint: Secondary | ICD-10-CM | POA: Diagnosis not present

## 2017-08-18 DIAGNOSIS — Z471 Aftercare following joint replacement surgery: Secondary | ICD-10-CM | POA: Diagnosis not present

## 2017-08-20 DIAGNOSIS — G894 Chronic pain syndrome: Secondary | ICD-10-CM | POA: Diagnosis not present

## 2017-08-20 DIAGNOSIS — F0390 Unspecified dementia without behavioral disturbance: Secondary | ICD-10-CM | POA: Diagnosis not present

## 2017-08-20 DIAGNOSIS — F339 Major depressive disorder, recurrent, unspecified: Secondary | ICD-10-CM | POA: Diagnosis not present

## 2017-08-20 DIAGNOSIS — R296 Repeated falls: Secondary | ICD-10-CM | POA: Diagnosis not present

## 2017-08-28 DIAGNOSIS — R062 Wheezing: Secondary | ICD-10-CM | POA: Diagnosis not present

## 2017-08-30 DIAGNOSIS — I10 Essential (primary) hypertension: Secondary | ICD-10-CM | POA: Diagnosis not present

## 2017-08-30 DIAGNOSIS — D649 Anemia, unspecified: Secondary | ICD-10-CM | POA: Diagnosis not present

## 2017-09-14 DIAGNOSIS — R1311 Dysphagia, oral phase: Secondary | ICD-10-CM | POA: Diagnosis not present

## 2017-09-14 DIAGNOSIS — F0280 Dementia in other diseases classified elsewhere without behavioral disturbance: Secondary | ICD-10-CM | POA: Diagnosis not present

## 2017-09-16 DIAGNOSIS — R05 Cough: Secondary | ICD-10-CM | POA: Diagnosis not present

## 2017-09-16 DIAGNOSIS — R1311 Dysphagia, oral phase: Secondary | ICD-10-CM | POA: Diagnosis not present

## 2017-09-16 DIAGNOSIS — R1310 Dysphagia, unspecified: Secondary | ICD-10-CM | POA: Diagnosis not present

## 2017-09-16 DIAGNOSIS — F0280 Dementia in other diseases classified elsewhere without behavioral disturbance: Secondary | ICD-10-CM | POA: Diagnosis not present

## 2017-09-16 DIAGNOSIS — R5383 Other fatigue: Secondary | ICD-10-CM | POA: Diagnosis not present

## 2017-09-19 DIAGNOSIS — D649 Anemia, unspecified: Secondary | ICD-10-CM | POA: Diagnosis not present

## 2017-09-19 DIAGNOSIS — I10 Essential (primary) hypertension: Secondary | ICD-10-CM | POA: Diagnosis not present

## 2017-09-20 DIAGNOSIS — R600 Localized edema: Secondary | ICD-10-CM | POA: Diagnosis not present

## 2017-09-20 DIAGNOSIS — R1311 Dysphagia, oral phase: Secondary | ICD-10-CM | POA: Diagnosis not present

## 2017-09-20 DIAGNOSIS — L84 Corns and callosities: Secondary | ICD-10-CM | POA: Diagnosis not present

## 2017-09-20 DIAGNOSIS — B351 Tinea unguium: Secondary | ICD-10-CM | POA: Diagnosis not present

## 2017-09-20 DIAGNOSIS — F0280 Dementia in other diseases classified elsewhere without behavioral disturbance: Secondary | ICD-10-CM | POA: Diagnosis not present

## 2017-09-20 DIAGNOSIS — I739 Peripheral vascular disease, unspecified: Secondary | ICD-10-CM | POA: Diagnosis not present

## 2017-09-21 DIAGNOSIS — G309 Alzheimer's disease, unspecified: Secondary | ICD-10-CM | POA: Diagnosis not present

## 2017-09-21 DIAGNOSIS — F419 Anxiety disorder, unspecified: Secondary | ICD-10-CM | POA: Diagnosis not present

## 2017-09-21 DIAGNOSIS — F0391 Unspecified dementia with behavioral disturbance: Secondary | ICD-10-CM | POA: Diagnosis not present

## 2017-09-22 DIAGNOSIS — R1311 Dysphagia, oral phase: Secondary | ICD-10-CM | POA: Diagnosis not present

## 2017-09-22 DIAGNOSIS — F0280 Dementia in other diseases classified elsewhere without behavioral disturbance: Secondary | ICD-10-CM | POA: Diagnosis not present

## 2017-09-22 DIAGNOSIS — R600 Localized edema: Secondary | ICD-10-CM | POA: Diagnosis not present

## 2017-09-22 DIAGNOSIS — M25532 Pain in left wrist: Secondary | ICD-10-CM | POA: Diagnosis not present

## 2017-09-24 DIAGNOSIS — R531 Weakness: Secondary | ICD-10-CM | POA: Diagnosis not present

## 2017-09-24 DIAGNOSIS — R5383 Other fatigue: Secondary | ICD-10-CM | POA: Diagnosis not present

## 2017-09-24 DIAGNOSIS — R1311 Dysphagia, oral phase: Secondary | ICD-10-CM | POA: Diagnosis not present

## 2017-09-24 DIAGNOSIS — F0280 Dementia in other diseases classified elsewhere without behavioral disturbance: Secondary | ICD-10-CM | POA: Diagnosis not present

## 2017-09-27 DIAGNOSIS — R1311 Dysphagia, oral phase: Secondary | ICD-10-CM | POA: Diagnosis not present

## 2017-09-27 DIAGNOSIS — F0280 Dementia in other diseases classified elsewhere without behavioral disturbance: Secondary | ICD-10-CM | POA: Diagnosis not present

## 2017-09-29 DIAGNOSIS — F419 Anxiety disorder, unspecified: Secondary | ICD-10-CM | POA: Diagnosis not present

## 2017-09-29 DIAGNOSIS — R1311 Dysphagia, oral phase: Secondary | ICD-10-CM | POA: Diagnosis not present

## 2017-09-29 DIAGNOSIS — F329 Major depressive disorder, single episode, unspecified: Secondary | ICD-10-CM | POA: Diagnosis not present

## 2017-09-29 DIAGNOSIS — F0280 Dementia in other diseases classified elsewhere without behavioral disturbance: Secondary | ICD-10-CM | POA: Diagnosis not present

## 2017-09-29 DIAGNOSIS — R52 Pain, unspecified: Secondary | ICD-10-CM | POA: Diagnosis not present

## 2017-09-29 DIAGNOSIS — F0390 Unspecified dementia without behavioral disturbance: Secondary | ICD-10-CM | POA: Diagnosis not present

## 2017-09-30 DIAGNOSIS — R1311 Dysphagia, oral phase: Secondary | ICD-10-CM | POA: Diagnosis not present

## 2017-09-30 DIAGNOSIS — F0280 Dementia in other diseases classified elsewhere without behavioral disturbance: Secondary | ICD-10-CM | POA: Diagnosis not present

## 2017-10-03 DIAGNOSIS — F0280 Dementia in other diseases classified elsewhere without behavioral disturbance: Secondary | ICD-10-CM | POA: Diagnosis not present

## 2017-10-03 DIAGNOSIS — R1311 Dysphagia, oral phase: Secondary | ICD-10-CM | POA: Diagnosis not present

## 2017-10-05 DIAGNOSIS — F0280 Dementia in other diseases classified elsewhere without behavioral disturbance: Secondary | ICD-10-CM | POA: Diagnosis not present

## 2017-10-05 DIAGNOSIS — R1311 Dysphagia, oral phase: Secondary | ICD-10-CM | POA: Diagnosis not present

## 2017-10-05 DIAGNOSIS — F419 Anxiety disorder, unspecified: Secondary | ICD-10-CM | POA: Diagnosis not present

## 2017-10-05 DIAGNOSIS — G309 Alzheimer's disease, unspecified: Secondary | ICD-10-CM | POA: Diagnosis not present

## 2017-10-05 DIAGNOSIS — F0391 Unspecified dementia with behavioral disturbance: Secondary | ICD-10-CM | POA: Diagnosis not present

## 2017-10-06 DIAGNOSIS — F0280 Dementia in other diseases classified elsewhere without behavioral disturbance: Secondary | ICD-10-CM | POA: Diagnosis not present

## 2017-10-06 DIAGNOSIS — R1311 Dysphagia, oral phase: Secondary | ICD-10-CM | POA: Diagnosis not present

## 2017-10-11 DIAGNOSIS — F0280 Dementia in other diseases classified elsewhere without behavioral disturbance: Secondary | ICD-10-CM | POA: Diagnosis not present

## 2017-10-11 DIAGNOSIS — R1311 Dysphagia, oral phase: Secondary | ICD-10-CM | POA: Diagnosis not present

## 2017-10-19 DIAGNOSIS — F0391 Unspecified dementia with behavioral disturbance: Secondary | ICD-10-CM | POA: Diagnosis not present

## 2017-10-19 DIAGNOSIS — F419 Anxiety disorder, unspecified: Secondary | ICD-10-CM | POA: Diagnosis not present

## 2017-10-19 DIAGNOSIS — G309 Alzheimer's disease, unspecified: Secondary | ICD-10-CM | POA: Diagnosis not present

## 2017-10-26 DIAGNOSIS — E039 Hypothyroidism, unspecified: Secondary | ICD-10-CM | POA: Diagnosis not present

## 2017-11-30 DIAGNOSIS — G309 Alzheimer's disease, unspecified: Secondary | ICD-10-CM | POA: Diagnosis not present

## 2017-11-30 DIAGNOSIS — F419 Anxiety disorder, unspecified: Secondary | ICD-10-CM | POA: Diagnosis not present

## 2017-11-30 DIAGNOSIS — F0391 Unspecified dementia with behavioral disturbance: Secondary | ICD-10-CM | POA: Diagnosis not present

## 2017-12-27 DIAGNOSIS — F0390 Unspecified dementia without behavioral disturbance: Secondary | ICD-10-CM | POA: Diagnosis not present

## 2017-12-27 DIAGNOSIS — F339 Major depressive disorder, recurrent, unspecified: Secondary | ICD-10-CM | POA: Diagnosis not present

## 2017-12-27 DIAGNOSIS — G894 Chronic pain syndrome: Secondary | ICD-10-CM | POA: Diagnosis not present

## 2017-12-27 DIAGNOSIS — F419 Anxiety disorder, unspecified: Secondary | ICD-10-CM | POA: Diagnosis not present

## 2017-12-28 DIAGNOSIS — F0391 Unspecified dementia with behavioral disturbance: Secondary | ICD-10-CM | POA: Diagnosis not present

## 2017-12-28 DIAGNOSIS — G309 Alzheimer's disease, unspecified: Secondary | ICD-10-CM | POA: Diagnosis not present

## 2017-12-28 DIAGNOSIS — F419 Anxiety disorder, unspecified: Secondary | ICD-10-CM | POA: Diagnosis not present

## 2017-12-30 DIAGNOSIS — Z9181 History of falling: Secondary | ICD-10-CM | POA: Diagnosis not present

## 2017-12-30 DIAGNOSIS — F0280 Dementia in other diseases classified elsewhere without behavioral disturbance: Secondary | ICD-10-CM | POA: Diagnosis not present

## 2018-01-03 DIAGNOSIS — B351 Tinea unguium: Secondary | ICD-10-CM | POA: Diagnosis not present

## 2018-01-03 DIAGNOSIS — R600 Localized edema: Secondary | ICD-10-CM | POA: Diagnosis not present

## 2018-01-03 DIAGNOSIS — I739 Peripheral vascular disease, unspecified: Secondary | ICD-10-CM | POA: Diagnosis not present

## 2018-01-05 DIAGNOSIS — F0280 Dementia in other diseases classified elsewhere without behavioral disturbance: Secondary | ICD-10-CM | POA: Diagnosis not present

## 2018-01-05 DIAGNOSIS — Z9181 History of falling: Secondary | ICD-10-CM | POA: Diagnosis not present

## 2018-03-17 ENCOUNTER — Telehealth: Payer: Self-pay | Admitting: Neurology

## 2018-03-17 NOTE — Telephone Encounter (Signed)
Pts daughter Carlisle Beers (Pts POA) called to inform us that the pt has passed.

## 2018-03-17 NOTE — Telephone Encounter (Signed)
Events noted, I did not call the family.

## 2018-03-19 DEATH — deceased

## 2018-03-22 ENCOUNTER — Ambulatory Visit: Payer: Medicare Other | Admitting: Neurology

## 2019-10-25 IMAGING — DX DG CHEST 2V
2 series · 2 of 2 positions shown · non-contrast
Comparison: December 31, 2010

CLINICAL DATA: Shortness of breath.  Alzheimer's dementia

EXAM:
CHEST - 2 VIEW

[chest lat]
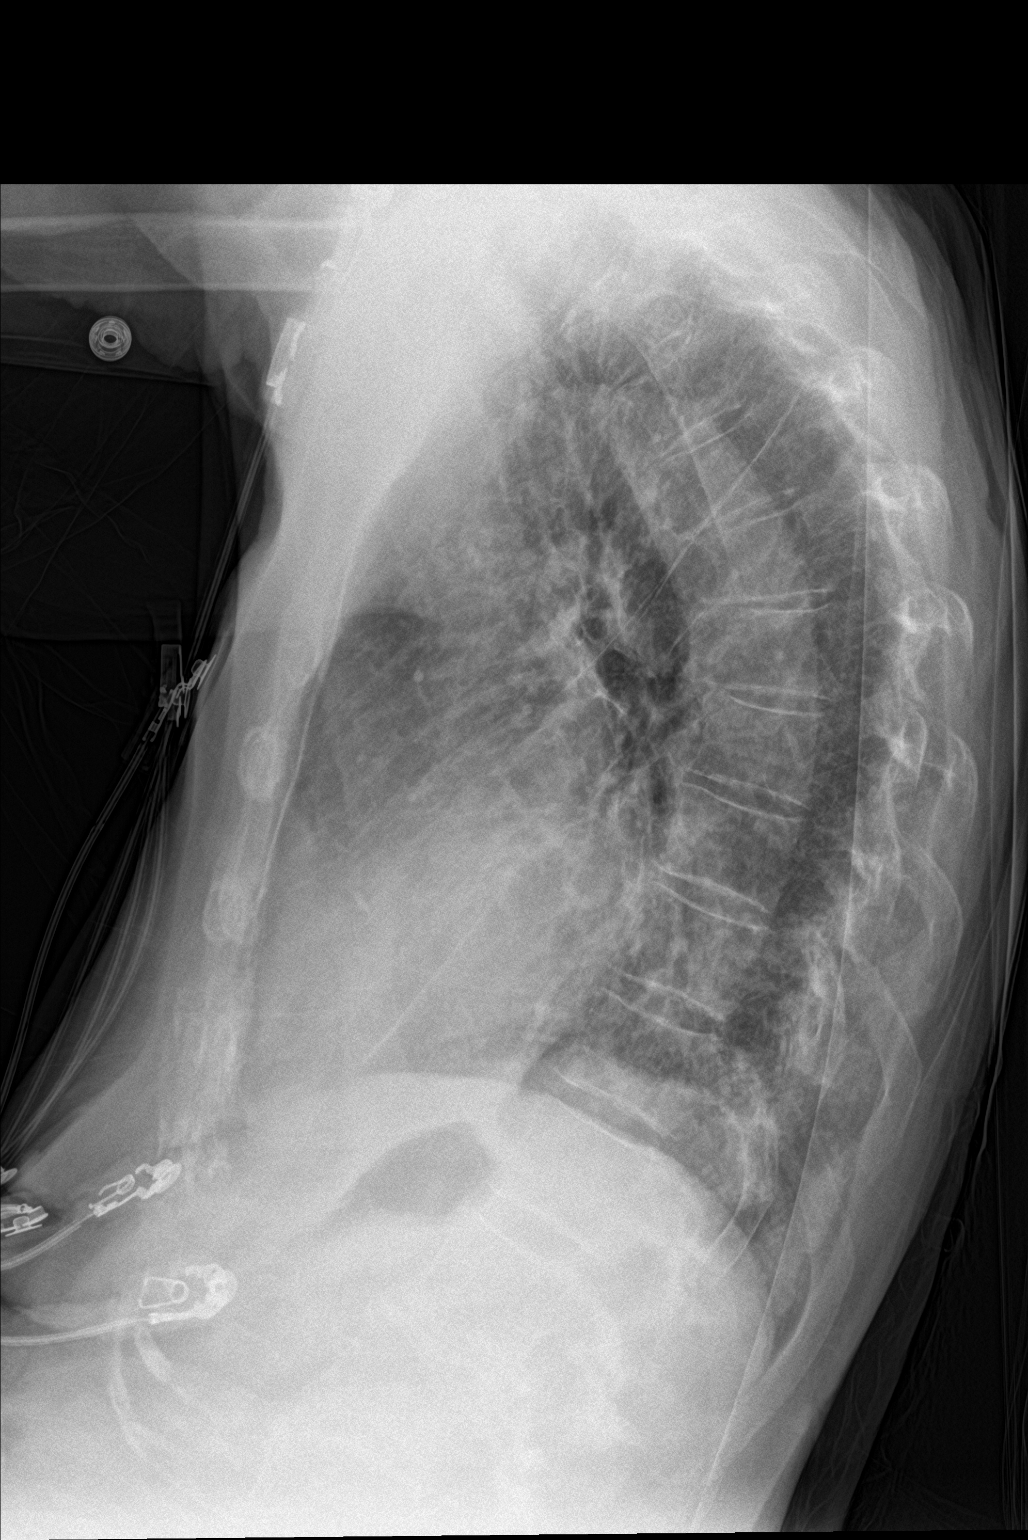

[chest ap]
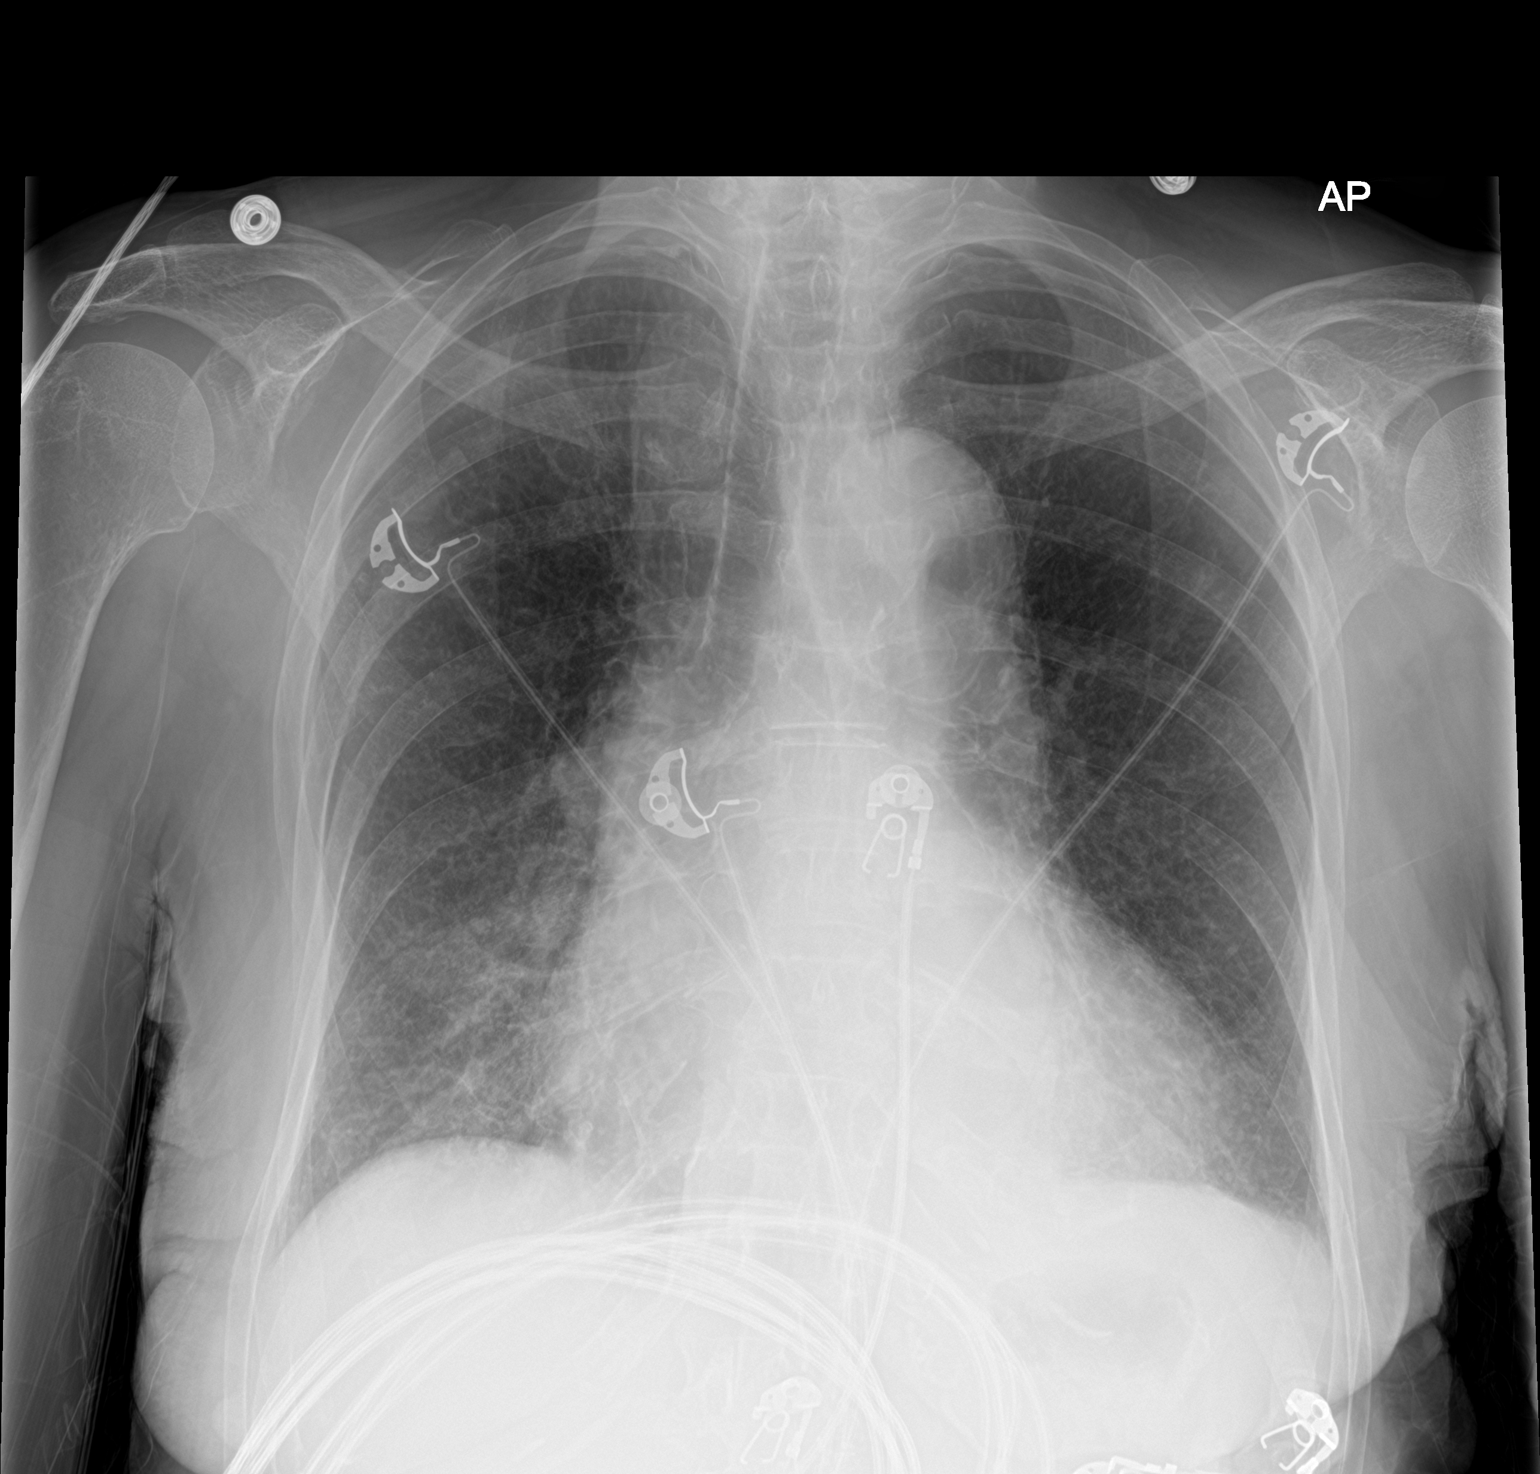

[2 of 2 positions shown; findings below may reference images not displayed]

FINDINGS: There is interstitial thickening in the mid and lower lung zones.
There is no edema or consolidation. The heart is mildly enlarged
with pulmonary vascularity within normal limits. No adenopathy.
There is aortic atherosclerosis. No bone lesions.
IMPRESSION: Interstitial thickening in the mid lower lung zones, probably due to
underlying fibrotic type change. No frank edema or consolidation.

Heart mildly enlarged. There is aortic atherosclerosis. No evident
adenopathy.

Aortic Atherosclerosis ([id number]).
